# Patient Record
Sex: Female | Born: 1945
Health system: Southern US, Community
[De-identification: ages and names within clinical notes are randomized; demographics above are authoritative.]

## PROBLEM LIST (undated history)

## (undated) DIAGNOSIS — E119 Type 2 diabetes mellitus without complications: Secondary | ICD-10-CM

## (undated) DIAGNOSIS — E669 Obesity, unspecified: Secondary | ICD-10-CM

## (undated) DIAGNOSIS — I10 Essential (primary) hypertension: Secondary | ICD-10-CM

## (undated) DIAGNOSIS — E785 Hyperlipidemia, unspecified: Secondary | ICD-10-CM

## (undated) DIAGNOSIS — M109 Gout, unspecified: Secondary | ICD-10-CM

## (undated) HISTORY — DX: Type 2 diabetes mellitus without complications: E11.9

## (undated) HISTORY — DX: Hyperlipidemia, unspecified: E78.5

## (undated) HISTORY — DX: Essential (primary) hypertension: I10

## (undated) HISTORY — DX: Gout, unspecified: M10.9

## (undated) HISTORY — DX: Obesity, unspecified: E66.9

---

## 2001-12-22 ENCOUNTER — Other Ambulatory Visit: Admission: RE | Admit: 2001-12-22 | Discharge: 2001-12-22 | Payer: Self-pay | Admitting: Family Medicine

## 2002-12-12 ENCOUNTER — Emergency Department (HOSPITAL_COMMUNITY): Admission: EM | Admit: 2002-12-12 | Discharge: 2002-12-13 | Payer: Self-pay | Admitting: Emergency Medicine

## 2009-07-03 ENCOUNTER — Encounter: Admission: RE | Admit: 2009-07-03 | Discharge: 2009-07-03 | Payer: Self-pay | Admitting: Internal Medicine

## 2009-09-10 ENCOUNTER — Encounter: Admission: RE | Admit: 2009-09-10 | Discharge: 2009-09-10 | Payer: Self-pay | Admitting: Internal Medicine

## 2012-05-01 IMAGING — CR DG KNEE 1-2V*R*
3 series · 3 of 3 positions shown · non-contrast
Comparison: None.

CLINICAL DATA: RIGHT KNEE - 1-2 VIEW

[view not recorded (1 of 3)]
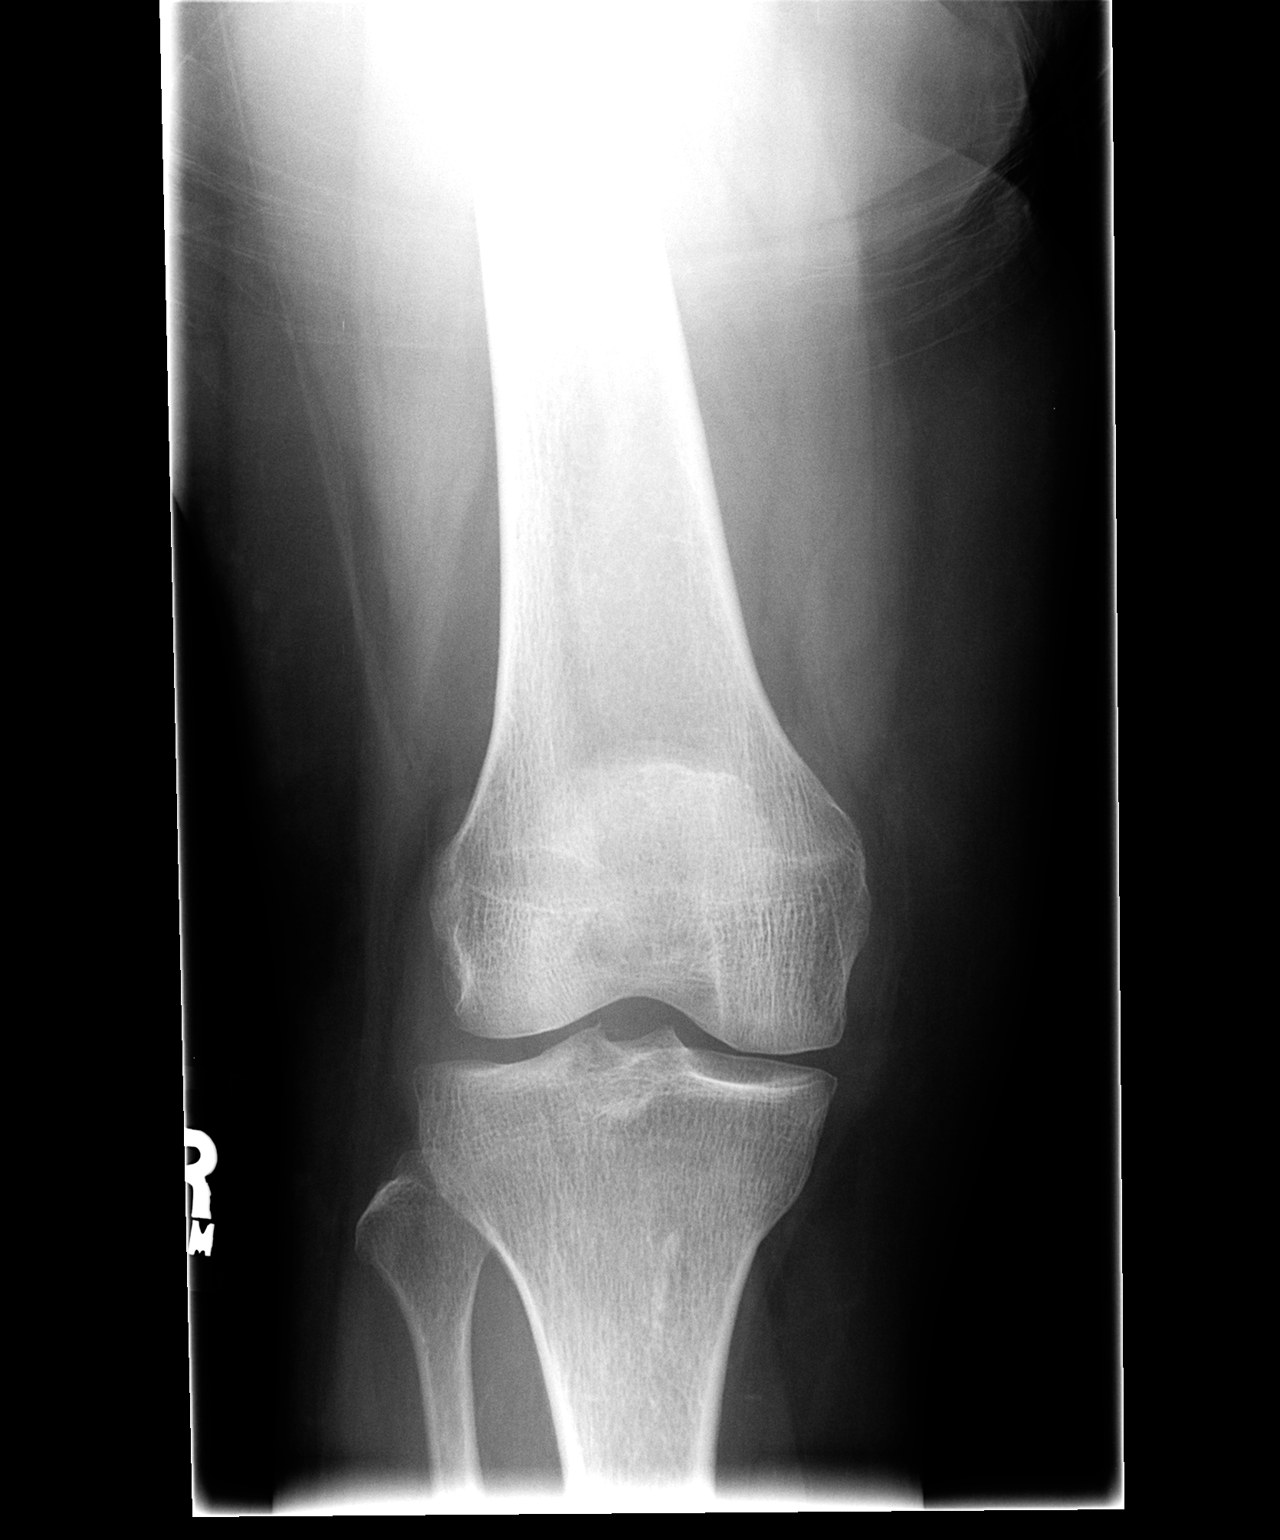

[view not recorded (2 of 3)]
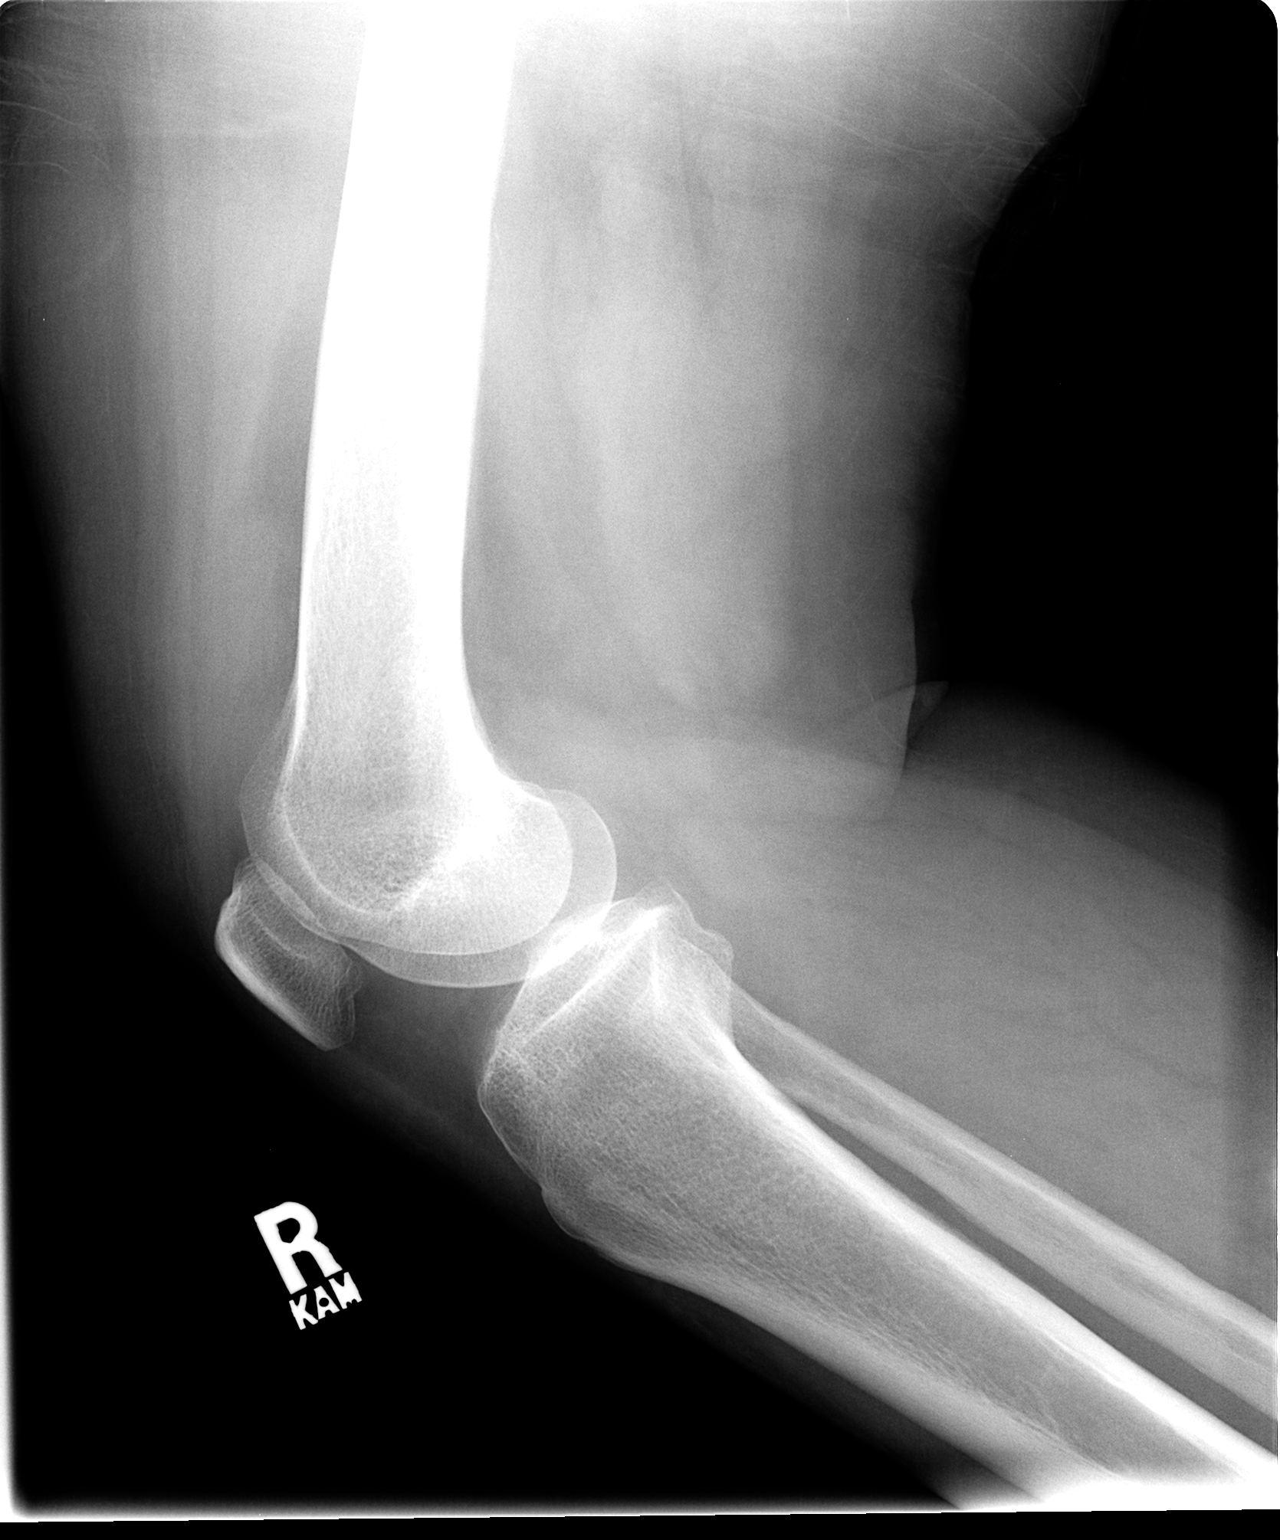

[view not recorded (3 of 3)]
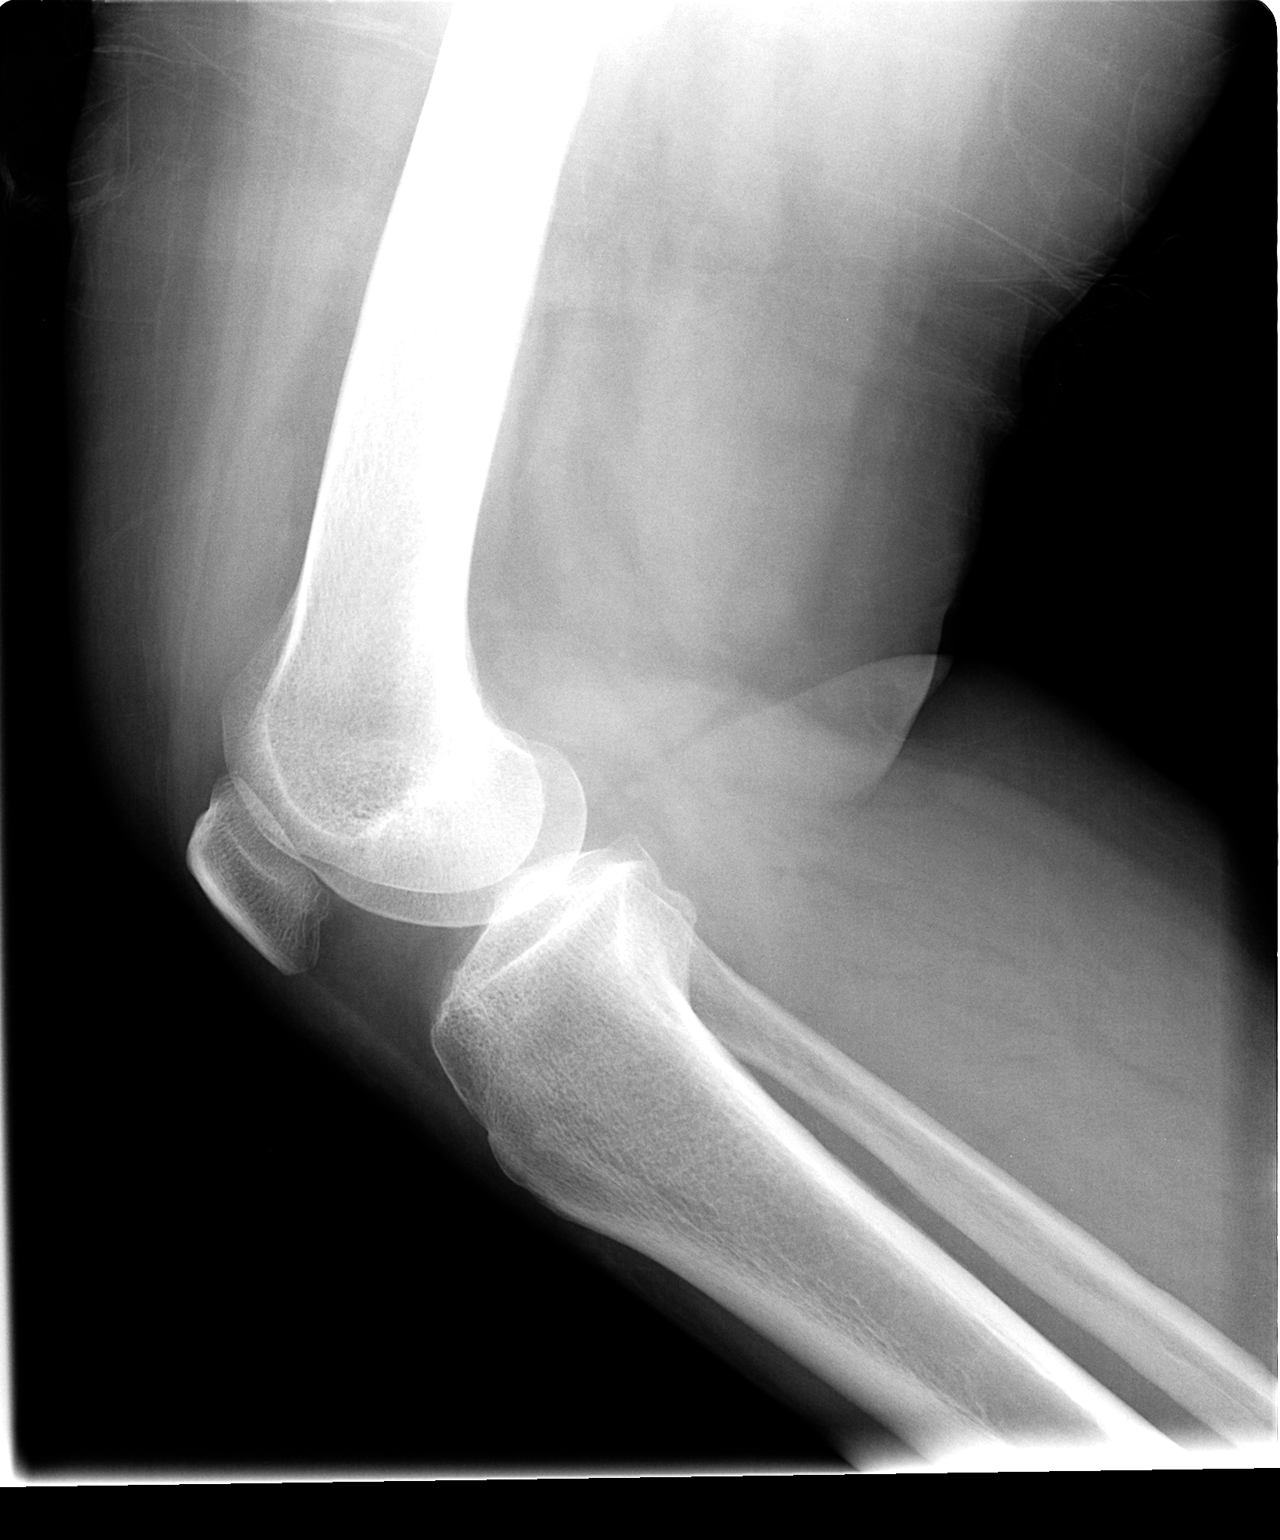

[3 of 3 positions shown; findings below may reference images not displayed]

FINDINGS: There is no evidence of fracture, dislocation, or joint
effusion.  There is no evidence of arthropathy or other focal bone
abnormality.  Soft tissues are unremarkable.
IMPRESSION: Negative.

## 2015-11-01 DIAGNOSIS — E119 Type 2 diabetes mellitus without complications: Secondary | ICD-10-CM | POA: Insufficient documentation

## 2015-11-01 DIAGNOSIS — E785 Hyperlipidemia, unspecified: Secondary | ICD-10-CM | POA: Insufficient documentation

## 2015-11-01 DIAGNOSIS — I10 Essential (primary) hypertension: Secondary | ICD-10-CM | POA: Insufficient documentation

## 2015-11-01 DIAGNOSIS — M109 Gout, unspecified: Secondary | ICD-10-CM

## 2015-11-01 HISTORY — DX: Hyperlipidemia, unspecified: E78.5

## 2015-11-01 HISTORY — DX: Gout, unspecified: M10.9

## 2016-03-19 DIAGNOSIS — R809 Proteinuria, unspecified: Secondary | ICD-10-CM | POA: Insufficient documentation

## 2017-04-23 ENCOUNTER — Ambulatory Visit: Payer: Self-pay | Admitting: Family Medicine

## 2017-04-27 ENCOUNTER — Encounter: Payer: Self-pay | Admitting: Family Medicine

## 2017-04-27 ENCOUNTER — Other Ambulatory Visit: Payer: Self-pay

## 2017-04-27 ENCOUNTER — Ambulatory Visit (INDEPENDENT_AMBULATORY_CARE_PROVIDER_SITE_OTHER): Payer: Medicare HMO | Admitting: Family Medicine

## 2017-04-27 VITALS — BP 140/72 | HR 70 | Temp 97.9°F | Ht 60.0 in | Wt 187.0 lb

## 2017-04-27 DIAGNOSIS — Z1159 Encounter for screening for other viral diseases: Secondary | ICD-10-CM

## 2017-04-27 DIAGNOSIS — Z6836 Body mass index (BMI) 36.0-36.9, adult: Secondary | ICD-10-CM | POA: Diagnosis not present

## 2017-04-27 DIAGNOSIS — I10 Essential (primary) hypertension: Secondary | ICD-10-CM | POA: Diagnosis not present

## 2017-04-27 DIAGNOSIS — E669 Obesity, unspecified: Secondary | ICD-10-CM

## 2017-04-27 DIAGNOSIS — E66812 Obesity, class 2: Secondary | ICD-10-CM

## 2017-04-27 DIAGNOSIS — E118 Type 2 diabetes mellitus with unspecified complications: Secondary | ICD-10-CM | POA: Diagnosis not present

## 2017-04-27 HISTORY — DX: Obesity, unspecified: E66.9

## 2017-04-27 LAB — POCT GLYCOSYLATED HEMOGLOBIN (HGB A1C): Hemoglobin A1C: 7.5

## 2017-04-27 MED ORDER — GLUCOSE BLOOD VI STRP: ORAL_STRIP | 12 refills | Status: AC

## 2017-04-27 MED ORDER — AMLODIPINE BESYLATE 5 MG PO TABS: 5.0000 mg | ORAL_TABLET | Freq: Every day | ORAL | 0 refills | Status: DC

## 2017-04-27 MED ORDER — METFORMIN HCL 1000 MG PO TABS: 1000.0000 mg | ORAL_TABLET | Freq: Two times a day (BID) | ORAL | 0 refills | Status: DC

## 2017-04-27 NOTE — Assessment & Plan Note (Signed)
Fasting lipid panel obtained today.

## 2017-04-27 NOTE — Assessment & Plan Note (Signed)
BP slightly elevated today but in the setting of being out of home amlodipine for the past 2 days. Will continue to monitor at future visits.

## 2017-04-27 NOTE — Progress Notes (Signed)
**Note Laura-Identified via Obfuscation** Subjective:  Laura Aguirre is a 72 y.o. female who presents to the [facility] today to establish care. History taken via Spanish video interpreter.  HPI:  Previously seen by Dr. De Aguirre, last seen 4-5 months ago Eye doctor Dr. Janina Aguirre, last saw recently and still goes to see on yearly basis.   Diabetes Takes metformin 1000mg  BID, tolerating well, ran out 2 days ago. Does not ever recall being on any other diabetes medications Disease Monitoring: checks sugars every other day at home Blood Sugar ranges-129-170,  avg 130-140 Polyuria- no New Visual problems- no  Hypoglycemic symptoms- no    HYPERTENSION  Disease Monitoring does not check at home Medications: on amlodipine 5mg  qd, tolerating well. Ran out of medications 2 days ago Chest pain- no      Dyspnea- no     Lightheadedness- no   Edema- no      ROS: per HPI, otherwise all systems reviewed and negative  PMH:  The following were reviewed and entered/updated in epic: Past Medical History:  Diagnosis Date  . Diabetes (HCC)   . HTN (hypertension)    Patient Active Problem List   Diagnosis Date Noted  . Obesity 04/27/2017  . HTN (hypertension)   . Diabetes (HCC)   . Gout 11/01/2015   History reviewed. No pertinent surgical history.  Family History  Problem Relation Age of Onset  . Diabetes Mother   . Kidney disease Mother   . Stroke Father   . High blood pressure Father     Medications- reviewed and updated Current Outpatient Medications  Medication Sig Dispense Refill  . amLODipine (NORVASC) 5 MG tablet Take 1 tablet (5 mg total) by mouth daily. 30 tablet 0  . glucose blood test strip 1 each by Other route daily as needed.    Marland Kitchen glucose blood test strip Use as instructed 100 each 12  . metFORMIN (GLUCOPHAGE) 1000 MG tablet Take 1 tablet (1,000 mg total) by mouth 2 (two) times daily with a meal. 60 tablet 0   No current facility-administered medications for this visit.     Allergies-reviewed  and updated No Known Allergies  Social History   Socioeconomic History  . Marital status: Single    Spouse name: Not on file  . Number of children: Not on file  . Years of education: Not on file  . Highest education level: Not on file  Occupational History  . Not on file  Social Needs  . Financial resource strain: Not on file  . Food insecurity:    Worry: Not on file    Inability: Not on file  . Transportation needs:    Medical: Not on file    Non-medical: Not on file  Tobacco Use  . Smoking status: Never Smoker  . Smokeless tobacco: Never Used  Substance and Sexual Activity  . Alcohol use: Never    Frequency: Never  . Drug use: Never  . Sexual activity: Not Currently  Lifestyle  . Physical activity:    Days per week: 7 days    Minutes per session: 30 min  . Stress: Not on file  Relationships  . Social connections:    Talks on phone: Not on file    Gets together: Not on file    Attends religious service: Not on file    Active member of club or organization: Not on file    Attends meetings of clubs or organizations: Not on file    Relationship status: Not on  file  Other Topics Concern  . Not on file  Social History Narrative   Lives at home with husband.     Objective:  Physical Exam: BP 140/72   Pulse 70   Temp 97.9 F (36.6 C) (Oral)   Ht 5' (1.524 m)   Wt 187 lb (84.8 kg)   SpO2 98%   BMI 36.52 kg/m   Gen: NAD, resting comfortably CV: RRR with no murmurs appreciated Pulm: NWOB, CTAB with no crackles, wheezes, or rhonchi GI: Normal bowel sounds present. Soft, Nontender, Nondistended. MSK: no edema, cyanosis, or clubbing noted Skin: warm, dry Neuro: grossly normal, moves all extremities Psych: Normal affect and thought content  Results for orders placed or performed in visit on 04/27/17 (from the past 72 hour(s))  POCT glycosylated hemoglobin (Hb A1C)     Status: None   Collection Time: 04/27/17  9:54 AM  Result Value Ref Range   Hemoglobin A1C  7.5      Assessment/Plan:  Diabetes (HCC) Doing well on metformin 1000mg  BID, refill given today. Patient is over 65yo so reasonable to have a relaxed a1c goal of 7-7.5 but as she is establishing care today will need to get ROI to obtain previous records at next visit. Will obtain baseline bloodwork and urine microalbumin.  HTN (hypertension) BP slightly elevated today but in the setting of being out of home amlodipine for the past 2 days. Will continue to monitor at future visits.  Obesity Fasting lipid panel obtained today.    Laura HerElsia J Ansley Stanwood, DO PGY-2, [facility] 04/27/2017 9:33 AM

## 2017-04-27 NOTE — Patient Instructions (Signed)
It was good to see you today!  For your diabetes and blood pressure I have refilled your medications.   We are checking some labs today. If results require attention, either myself or my nurse will get in touch with you. If everything is normal, you will get a letter in the mail or a message in My Chart. Please give us a call if you do not hear from us after 2 weeks.    Please check-out at the front desk before leaving the clinic. Make an appointment in  1 month for diabetes follow up.   Please bring all of your medications with you to each visit.   Sign up for My Chart to have easy access to your labs results, and communication with your primary care physician.  Feel free to call with any questions or concerns at any time, at [phone].   Take care,  Dr. [name] HerElsia J Nihaal Friesen, DO [facility]

## 2017-04-27 NOTE — Assessment & Plan Note (Addendum)
Doing well on metformin 1000mg  BID, refill given today. Patient is over 72yo so reasonable to have a relaxed a1c goal of 7-7.5 but as she is establishing care today will need to get ROI to obtain previous records at next visit. Will obtain baseline bloodwork and urine microalbumin.

## 2017-04-28 ENCOUNTER — Encounter: Payer: Self-pay | Admitting: Family Medicine

## 2017-04-28 ENCOUNTER — Telehealth: Payer: Self-pay | Admitting: Family Medicine

## 2017-04-28 NOTE — Telephone Encounter (Signed)
Attempted to call patient regarding lab results. Home phone number disconnected. Work called and was told that no Laura Aguirre is employed at that location. Patient has visit scheduled me in May. If she cannot be contacted via phone then will need to discuss that her ASCVD risk elevated to 25.5% at that visit.

## 2017-04-29 LAB — LIPID PANEL
Chol/HDL Ratio: 3 ratio (ref 0.0–4.4)
Cholesterol, Total: 167 mg/dL (ref 100–199)
HDL: 56 mg/dL (ref >39)
LDL Calculated: 86 mg/dL (ref 0–99)
Triglycerides: 126 mg/dL (ref 0–149)
VLDL Cholesterol Cal: 25 mg/dL (ref 5–40)

## 2017-04-29 LAB — PROTEIN / CREATININE RATIO, URINE
Creatinine, Urine: 51 mg/dL
PROTEIN UR: 20.3 mg/dL
PROTEIN/CREAT RATIO: 398 mg/g{creat} — AB (ref 0–200)

## 2017-04-29 LAB — CBC
HEMATOCRIT: 40.7 % (ref 34.0–46.6)
Hemoglobin: 13.1 g/dL (ref 11.1–15.9)
MCH: 28.6 pg (ref 26.6–33.0)
MCHC: 32.2 g/dL (ref 31.5–35.7)
MCV: 89 fL (ref 79–97)
Platelets: 221 10*3/uL (ref 150–379)
RBC: 4.58 x10E6/uL (ref 3.77–5.28)
RDW: 15 % (ref 12.3–15.4)
WBC: 6.7 10*3/uL (ref 3.4–10.8)

## 2017-04-29 LAB — BASIC METABOLIC PANEL
BUN / CREAT RATIO: 23 (ref 12–28)
BUN: 25 mg/dL (ref 8–27)
CHLORIDE: 105 mmol/L (ref 96–106)
CO2: 23 mmol/L (ref 20–29)
CREATININE: 1.07 mg/dL — AB (ref 0.57–1.00)
Calcium: 9.1 mg/dL (ref 8.7–10.3)
GFR calc non Af Amer: 53 mL/min/{1.73_m2} — ABNORMAL LOW (ref >59)
GFR, EST AFRICAN AMERICAN: 61 mL/min/{1.73_m2} (ref >59)
Glucose: 128 mg/dL — ABNORMAL HIGH (ref 65–99)
Potassium: 5.4 mmol/L — ABNORMAL HIGH (ref 3.5–5.2)
SODIUM: 141 mmol/L (ref 134–144)

## 2017-04-29 LAB — HEPATITIS C ANTIBODY

## 2017-05-04 ENCOUNTER — Telehealth: Payer: Self-pay | Admitting: Family Medicine

## 2017-05-04 NOTE — Telephone Encounter (Signed)
Patient did not receive the lancets for her blood glucose prescription.  Goes to [facility] [address].  Thank you.

## 2017-05-04 NOTE — Telephone Encounter (Signed)
Please call patient w/lab results and new cell number in chart.

## 2017-05-07 NOTE — Telephone Encounter (Signed)
Called pharmacy for this, they are unaware which glucose monitoring device she is currently using for compatibility.   I tried calling pt to ask, but unable to reach.  Will need to clarify this with patient before I call it in, thanks

## 2017-05-10 DIAGNOSIS — R69 Illness, unspecified: Secondary | ICD-10-CM | POA: Diagnosis not present

## 2017-05-10 MED ORDER — ONETOUCH DELICA LANCETS 33G MISC: 1.0000 | 0 refills | Status: DC

## 2017-05-10 MED ORDER — ONETOUCH DELICA LANCING DEV MISC: 1.0000 | 0 refills | Status: AC

## 2017-05-10 NOTE — Telephone Encounter (Signed)
LM for patient to call back to confirm which meter she is currently using but per her insurance they cover one touch verio.  This device is compatible with one touch delica lancets and lancing device.  Will forward to MD to send this brand in of her lancets and see if it is covered.  Jazmin Hartsell,CMA

## 2017-05-10 NOTE — Telephone Encounter (Signed)
Attempted to call patient via phone spanish interpreter. No response, message was left for patient to call back.

## 2017-05-10 NOTE — Telephone Encounter (Signed)
Patient called in other phone encounter via spanish phone interpreter. No response, a message was left.

## 2017-05-25 ENCOUNTER — Other Ambulatory Visit: Payer: Self-pay | Admitting: Family Medicine

## 2017-05-25 DIAGNOSIS — E118 Type 2 diabetes mellitus with unspecified complications: Secondary | ICD-10-CM

## 2017-06-01 ENCOUNTER — Other Ambulatory Visit: Payer: Self-pay

## 2017-06-01 ENCOUNTER — Ambulatory Visit (INDEPENDENT_AMBULATORY_CARE_PROVIDER_SITE_OTHER): Payer: Medicare HMO | Admitting: Family Medicine

## 2017-06-01 ENCOUNTER — Encounter: Payer: Self-pay | Admitting: Family Medicine

## 2017-06-01 VITALS — BP 124/68 | HR 73 | Temp 98.5°F | Ht 60.0 in | Wt 185.0 lb

## 2017-06-01 DIAGNOSIS — M1A9XX0 Chronic gout, unspecified, without tophus (tophi): Secondary | ICD-10-CM

## 2017-06-01 DIAGNOSIS — Z6836 Body mass index (BMI) 36.0-36.9, adult: Secondary | ICD-10-CM | POA: Diagnosis not present

## 2017-06-01 DIAGNOSIS — E118 Type 2 diabetes mellitus with unspecified complications: Secondary | ICD-10-CM | POA: Diagnosis not present

## 2017-06-01 DIAGNOSIS — I10 Essential (primary) hypertension: Secondary | ICD-10-CM

## 2017-06-01 DIAGNOSIS — E785 Hyperlipidemia, unspecified: Secondary | ICD-10-CM

## 2017-06-01 DIAGNOSIS — E669 Obesity, unspecified: Secondary | ICD-10-CM | POA: Diagnosis not present

## 2017-06-01 MED ORDER — COLCHICINE 0.6 MG PO TABS: 0.6000 mg | ORAL_TABLET | Freq: Every day | ORAL | 2 refills | Status: DC

## 2017-06-01 MED ORDER — METFORMIN HCL 1000 MG PO TABS: ORAL_TABLET | ORAL | 0 refills | Status: DC

## 2017-06-01 MED ORDER — PRAVASTATIN SODIUM 20 MG PO TABS: 20.0000 mg | ORAL_TABLET | Freq: Every day | ORAL | 0 refills | Status: DC

## 2017-06-01 MED ORDER — LISINOPRIL 10 MG PO TABS: 10.0000 mg | ORAL_TABLET | Freq: Every day | ORAL | 0 refills | Status: DC

## 2017-06-01 MED ORDER — ONETOUCH ULTRASOFT LANCETS MISC: 12 refills | Status: AC

## 2017-06-01 NOTE — Assessment & Plan Note (Signed)
Controlled and at goal.  However patient had elevated protein to creatinine ratio likely due to diabetes.  She has previously been on lisinopril 5 mg per chart review and has tolerated this well.  She will need to be on an ACEi or ARB for kidney protection so we will stop amlodipine and start lisinopril 10 mg daily.  Follow-up in 6 weeks for repeat BMP and blood pressure recheck.

## 2017-06-01 NOTE — Patient Instructions (Addendum)
For your gout - I have refilled your colchicine - We can stay off allopurinol for now, if you have more gout attacks then call me so we can restart. But do not start allopurinol during an acute attack.  For your blood pressure, - stop amlodipine, start lisinopril. - come back in 6 weeks  For your cholesterol - start pravastin in 2 weeks.   Opciones de alimentos para Stage manager de triglicridos (Food Choices to Lower Your Triglycerides) Los triglicridos son un tipo de grasas que se Hotel manager. Un nivel elevado de triglicridos puede aumentar el riesgo de padecer enfermedades cardacas e infartos. Si sus niveles de triglicridos son altos, los alimentos que se ingieren y los hbitos de alimentacin son Engineer, production. Elegir los alimentos adecuados puede ayudar a Stage manager de triglicridos. QU PAUTAS GENERALES DEBO SEGUIR?  Baje de peso si es necesario.  Limite o evite el alcohol.  Llene la mitad del plato con vegetales y ensaladas de hojas verdes.  Limite las frutas a dos porciones por da. Elija frutas en lugar de jugos.  Ocupe un cuarto del plato con cereales integrales. Busque la palabra "integral" en Estate agent de la lista de ingredientes.  Llene un cuarto del plato con alimentos con protenas magras.  Disfrute de pescados grasos (como salmn, caballa, sardinas y atn) tres veces por semana.  Elija las grasas saludables.  Limite los alimentos con alto contenido de almidn y International aid/development worker.  Consuma ms comida casera y menos de restaurante, de buf y comida rpida.  Limite el consumo de alimentos fritos.  Cocine los alimentos utilizando mtodos que no sean la fritura.  Limite el consumo de grasas saturadas.  Verifique las listas de ingredientes para evitar alimentos con aceites parcialmente hidrogenados (grasas trans).  QU ALIMENTOS PUEDO COMER? Cereales Cereales integrales, como los panes de salvado o [place], las [place], los cereales y  las pastas. Avena sin endulzar, trigo, [place], quinua o arroz integral. Tortillas de harina de maz o de salvado. Vegetales Verduras frescas o congeladas (crudas, al vapor, asadas o grilladas). Ensaladas de hojas verdes. Fruits Frutas frescas, en conserva (en su jugo natural) o frutas congeladas. Carnes y otros productos con protenas Carne de res molida (al 85% o ms [place]), carne de res de animales alimentados con pastos o carne de res sin la grasa. Pollo o pavo sin piel. Carne de pollo o de [place]. Cerdo sin la grasa. Todos los pescados y frutos de mar. Huevos. Porotos, guisantes o lentejas secos. Frutos secos o semillas sin sal. Frijoles secos o en lata sin sal. Lcteos Productos lcteos con bajo contenido de grasas, como [place] o al 1%, quesos reducidos en grasas o al 2%, ricota con bajo contenido de grasas o [employer], o yogur natural con bajo contenido de Hoffman. Grasas y Writer en barra que no contengan grasas trans. [place] y condimentos para ensaladas livianos o reducidos en grasas. [place]. Aceites de crtamo, oliva o canola. Mantequilla natural de man o almendra. Los artculos mencionados arriba pueden no ser [employer] de las bebidas o los alimentos recomendados. Comunquese con el nutricionista para conocer ms opciones. QU ALIMENTOS NO SE RECOMIENDAN? Cereales Pan blanco. Pastas blancas. Arroz blanco. Pan de maz. Bagels, pasteles y croissants. Galletas saladas que contengan grasas trans. Vegetales Papas blancas. Maz. Vegetales con crema o fritos. Verduras en salsa de [place]. Fruits Frutas secas. Fruta enlatada en almbar liviano o espeso. Jugo de frutas. Carnes y otros productos con protenas  Cortes de carne con grasa. Costillas, alas de pollo, tocineta, salchicha, mortadela, salame, chinchulines, tocino, perros calientes, salchichas alemanas y embutidos envasados. Lcteos Leche entera o al 2%, crema, mezcla de [place] y crema y  queso crema. Yogur entero o endulzado. Quesos con toda su grasa. Cremas no lcteas y coberturas batidas. Quesos procesados, quesos para untar o cuajadas. Dulces y postres Jarabe de maz, azcares, miel y Radio broadcast assistant. Caramelos. Mermelada y [place]. Doreen Beam. Cereales endulzados. Galletas, pasteles, bizcochuelos, donas, muffins y helado. Grasas y [address], [place] en barra, [place] de [place], [place], [place] clarificada o grasa de tocino. Aceites de coco, de palmiste o de palma. Bebidas Alcohol. Bebidas endulzadas (como refrescos, limonadas y bebidas frutales o ponches). Los artculos mencionados arriba pueden no ser [employer] de las bebidas y los alimentos que se Theatre stage manager. Comunquese con el nutricionista para recibir ms informacin. Esta informacin no tiene Theme park manager el consejo del mdico. Asegrese de hacerle al mdico cualquier pregunta que tenga. Document Released: 06/18/2009 Document Revised: 01/03/2013 Document Reviewed: 11/02/2012 Elsevier Interactive Patient Education  2017 [employer].

## 2017-06-01 NOTE — Assessment & Plan Note (Signed)
Patient's 89yr ASCVD risk is 20.7% and her LDL is 89 above goal of 70.  Discussed starting low intensity statin as patient has issues with gout and is on colchicine which can interact causing myalgias.  Patient was counseled regarding this possible side effect and she will call if she experiences muscle aches.  She will start this in 2 weeks as she is currently switching blood pressure medications and we discussed avoiding multiple medication changes at one time.

## 2017-06-01 NOTE — Assessment & Plan Note (Signed)
At goal for age.  Continue metformin 1000 mg twice daily.

## 2017-06-01 NOTE — Progress Notes (Signed)
    Subjective:  Laura Aguirre is a 72 y.o. female who presents to the [facility] today for diabetes and gout follow-up.  HPI:  Gout attack Last week, L ankle and R big toe. Now resolved after taking colchicine. Needs refills.  Had been 6 months since last gout attack. Has been off allopurinol for 6 months as well. Reluctant to restart allopurinol as wants to minimize her pill burden.  Thinks that this flare was due to eating beef.   Diabetes Taking metformin  BID, compliant and tolerating well.  Checks her sugars at home because her last doctor told her to try and keep her sugars at around 120 that was when she was on additional medication per chart review this appears to be invokana. Denies hypoglycemic events.  No polyuria, polydipsia.  No vision changes.  HTN Recalls being on lisinopril in the past and had tolerated well in the past without side effects  No lightheadedness, dizziness, chest pain, shortness of breath, lower extremity edema Does not check blood pressure at home Has been taking amlodipine 5 mg daily, compliant, tolerating well.    ROS: Per HPI  PMH: She reports that she has never smoked. She has never used smokeless tobacco. She reports that she does not drink alcohol or use drugs.  Objective:  Physical Exam: BP 124/68   Pulse 73   Temp 98.5 F (36.9 C) (Oral)   Ht 5' (1.524 m)   Wt 185 lb (83.9 kg)   SpO2 97%   BMI 36.13 kg/m   Gen: NAD, resting comfortably CV: RRR with no murmurs appreciated Pulm: NWOB, CTAB with no crackles, wheezes, or rhonchi GI: Normal bowel sounds present. Soft, Nontender, Nondistended. MSK: no edema, cyanosis, or clubbing noted Skin: warm, dry Neuro: grossly normal, moves all extremities Psych: Normal affect and thought content   Assessment/Plan:  Diabetes mellitus (HCC) At goal for age.  Continue metformin 1000 mg twice daily.  Essential hypertension Controlled and at goal.  However patient had elevated protein to  creatinine ratio likely due to diabetes.  She has previously been on lisinopril 5 mg per chart review and has tolerated this well.  She will need to be on an ACEi or ARB for kidney protection so we will stop amlodipine and start lisinopril 10 mg daily.  Follow-up in 6 weeks for repeat BMP and blood pressure recheck.  Hyperlipidemia Patient's 20yr ASCVD risk is 20.7% and her LDL is 89 above goal of 70.  Discussed starting low intensity statin as patient has issues with gout and is on colchicine which can interact causing myalgias.  Patient was counseled regarding this possible side effect and she will call if she experiences muscle aches.  She will start this in 2 weeks as she is currently switching blood pressure medications and we discussed avoiding multiple medication changes at one time.   [name] Her, DO PGY-2, [facility] 06/01/2017 9:37 AM

## 2017-07-09 ENCOUNTER — Other Ambulatory Visit: Payer: Self-pay | Admitting: Family Medicine

## 2017-07-09 DIAGNOSIS — I10 Essential (primary) hypertension: Secondary | ICD-10-CM

## 2017-07-14 ENCOUNTER — Other Ambulatory Visit: Payer: Self-pay | Admitting: Family Medicine

## 2017-07-14 DIAGNOSIS — E669 Obesity, unspecified: Secondary | ICD-10-CM

## 2017-07-14 DIAGNOSIS — Z6836 Body mass index (BMI) 36.0-36.9, adult: Principal | ICD-10-CM

## 2017-07-19 ENCOUNTER — Other Ambulatory Visit: Payer: Self-pay

## 2017-07-19 DIAGNOSIS — E118 Type 2 diabetes mellitus with unspecified complications: Secondary | ICD-10-CM

## 2017-07-19 MED ORDER — METFORMIN HCL 1000 MG PO TABS: ORAL_TABLET | ORAL | 0 refills | Status: DC

## 2017-08-07 ENCOUNTER — Other Ambulatory Visit: Payer: Self-pay | Admitting: Family Medicine

## 2017-08-07 DIAGNOSIS — I10 Essential (primary) hypertension: Secondary | ICD-10-CM

## 2017-08-12 ENCOUNTER — Ambulatory Visit (INDEPENDENT_AMBULATORY_CARE_PROVIDER_SITE_OTHER): Payer: Medicare HMO | Admitting: Family Medicine

## 2017-08-12 ENCOUNTER — Encounter: Payer: Self-pay | Admitting: Family Medicine

## 2017-08-12 ENCOUNTER — Other Ambulatory Visit: Payer: Self-pay

## 2017-08-12 VITALS — BP 138/69 | HR 70 | Temp 98.3°F | Wt 186.0 lb

## 2017-08-12 DIAGNOSIS — Z0000 Encounter for general adult medical examination without abnormal findings: Secondary | ICD-10-CM

## 2017-08-12 DIAGNOSIS — I10 Essential (primary) hypertension: Secondary | ICD-10-CM | POA: Diagnosis not present

## 2017-08-12 DIAGNOSIS — E669 Obesity, unspecified: Secondary | ICD-10-CM | POA: Diagnosis not present

## 2017-08-12 DIAGNOSIS — E118 Type 2 diabetes mellitus with unspecified complications: Secondary | ICD-10-CM

## 2017-08-12 DIAGNOSIS — Z6836 Body mass index (BMI) 36.0-36.9, adult: Secondary | ICD-10-CM | POA: Diagnosis not present

## 2017-08-12 DIAGNOSIS — Z1231 Encounter for screening mammogram for malignant neoplasm of breast: Secondary | ICD-10-CM | POA: Diagnosis not present

## 2017-08-12 DIAGNOSIS — M1A9XX0 Chronic gout, unspecified, without tophus (tophi): Secondary | ICD-10-CM

## 2017-08-12 DIAGNOSIS — Z1239 Encounter for other screening for malignant neoplasm of breast: Secondary | ICD-10-CM

## 2017-08-12 DIAGNOSIS — E785 Hyperlipidemia, unspecified: Secondary | ICD-10-CM | POA: Diagnosis not present

## 2017-08-12 LAB — POCT GLYCOSYLATED HEMOGLOBIN (HGB A1C): HbA1c, POC (controlled diabetic range): 6.8 % (ref 0.0–7.0)

## 2017-08-12 MED ORDER — METFORMIN HCL 1000 MG PO TABS: ORAL_TABLET | ORAL | 3 refills | Status: DC

## 2017-08-12 MED ORDER — ALLOPURINOL 100 MG PO TABS: 100.0000 mg | ORAL_TABLET | Freq: Every day | ORAL | 3 refills | Status: DC

## 2017-08-12 MED ORDER — PRAVASTATIN SODIUM 20 MG PO TABS: ORAL_TABLET | ORAL | 3 refills | Status: DC

## 2017-08-12 MED ORDER — LISINOPRIL 10 MG PO TABS: ORAL_TABLET | ORAL | 3 refills | Status: DC

## 2017-08-12 NOTE — Patient Instructions (Signed)
Your diabetes is good.  No changes to medications today.  Please get your mammogram.  Call your eye doctor for appointment and send us the records.  See you in  6 months.

## 2017-08-12 NOTE — Assessment & Plan Note (Signed)
Doing well on pravastatin 20mg  qd. No myalgias. Continue current management

## 2017-08-12 NOTE — Assessment & Plan Note (Addendum)
Stable and well controlled. Continue metformin 1000mg  BID Patient to schedule her own DM eye exam and plans to request records to be sent here.

## 2017-08-12 NOTE — Assessment & Plan Note (Signed)
Stable and at goal. The one episode of lightheadedness is likely not clinically significant. Advised good water intake during hot weather to avoid standing up too quickly if this happens again. Continue lisinopril 10mg  qd.

## 2017-08-12 NOTE — Assessment & Plan Note (Signed)
Mammogram ordered today 

## 2017-08-12 NOTE — Progress Notes (Signed)
    Subjective:  Laura Aguirre is a 72 y.o. female who [facility] today for follow up. History via Spanish video interpreter  HPI:  HYPERTENSION   Medications taking lisinopril 10mg  qd Compliance- good  Chest pain- no      Dyspnea- no  Edema- no Did have one episode of lightheadness/dizziness with sweating when she stood up too quickly. This was unusual for her and has not happened since       DIABETES  Medications metformin 1000mg  BID, tolerating well Compliance- good Has been eating healthier recently  Hypoglycemic symptoms- no  Polyuria- no New Visual problems- no Has not seen an eye doctor in the last year, plans to call and schedule      HYPERLIPIDEMIA  Medications: taking pravastatin 20mg  daily, tolerating well Compliance- good RUQ pain- no  Muscle aches- no    GOUT Takes colchicine for this. Has not had a gout flare for a long time. Has been out of allopurinol for the past 6-7 months.   ROS See HPI above   PMH Smoking Status noted     Objective:  Physical Exam: BP 138/69   Pulse 70   Temp 98.3 F (36.8 C) (Oral)   Wt 186 lb (84.4 kg)   SpO2 97%   BMI 36.33 kg/m   Gen: NAD, resting comfortably CV: RRR with no murmurs appreciated Pulm: NWOB, CTAB with no crackles, wheezes, or rhonchi GI: Normal bowel sounds present. Soft, Nontender, Nondistended. MSK: no edema, cyanosis, or clubbing noted Skin: warm, dry Neuro: grossly normal, moves all extremities Psych: Normal affect and thought content  Results for orders placed or performed in visit on 08/12/17 (from the past 72 hour(s))  HgB A1c     Status: None   Collection Time: 08/12/17  9:23 AM  Result Value Ref Range   Hemoglobin A1C  4.0 - 5.6 %   HbA1c POC (<> result, manual entry)  4.0 - 5.6 %   HbA1c, POC (prediabetic range)  5.7 - 6.4 %   HbA1c, POC (controlled diabetic range) 6.8 0.0 - 7.0 %     Assessment/Plan:  Essential hypertension Stable and at goal. The one episode of  lightheadedness is likely not clinically significant. Advised good water intake during hot weather to avoid standing up too quickly if this happens again. Continue lisinopril 10mg  qd.  Diabetes mellitus (HCC) Stable and well controlled. Continue metformin 1000mg  BID Patient to schedule her own DM eye exam and plans to request records to be sent here.  Hyperlipidemia Doing well on pravastatin 20mg  qd. No myalgias. Continue current management  Gout Doing well without any acute flares but has been off her allopurinol for the last 6-7 months. Refill given and recommended prophylaxis with colchicine over the next few months.  Healthcare maintenance Mammogram ordered today.   [name] HerElsia J Malakie Balis, DO PGY-3, [facility] 08/12/2017 9:28 AM

## 2017-08-12 NOTE — Assessment & Plan Note (Signed)
Doing well without any acute flares but has been off her allopurinol for the last 6-7 months. Refill given and recommended prophylaxis with colchicine over the next few months.

## 2018-06-30 ENCOUNTER — Ambulatory Visit (INDEPENDENT_AMBULATORY_CARE_PROVIDER_SITE_OTHER): Payer: Medicare HMO | Admitting: Family Medicine

## 2018-06-30 ENCOUNTER — Other Ambulatory Visit: Payer: Self-pay

## 2018-06-30 VITALS — BP 110/70 | HR 85

## 2018-06-30 DIAGNOSIS — M1A9XX0 Chronic gout, unspecified, without tophus (tophi): Secondary | ICD-10-CM | POA: Diagnosis not present

## 2018-06-30 DIAGNOSIS — I10 Essential (primary) hypertension: Secondary | ICD-10-CM | POA: Diagnosis not present

## 2018-06-30 DIAGNOSIS — E119 Type 2 diabetes mellitus without complications: Secondary | ICD-10-CM | POA: Diagnosis not present

## 2018-06-30 LAB — POCT GLYCOSYLATED HEMOGLOBIN (HGB A1C): HbA1c, POC (controlled diabetic range): 7.7 % — AB (ref 0.0–7.0)

## 2018-06-30 MED ORDER — IBUPROFEN 600 MG PO TABS: 600.0000 mg | ORAL_TABLET | Freq: Three times a day (TID) | ORAL | 0 refills | Status: DC

## 2018-06-30 NOTE — Patient Instructions (Signed)
Take the ibuprofen 3 times per day with food, for the next 5 days. Call us if you have any stomach concerns. It should become less swollen and less painful, but the deposits under the skin may take several weeks to go away.

## 2018-06-30 NOTE — Assessment & Plan Note (Signed)
a1c up today, discuss with PCP next month. May be due to acute inflammation of gout raising CBGs.

## 2018-06-30 NOTE — Progress Notes (Signed)
   CC: finger swollen   HPI  Mass on L 5th digit - has been taking her gout pill x 5 days colchicine. Hx of guot in her feet before, this is the first time in her finger. For colchicine she took 2 then 1 daily. Her pain got a little better after this, but not the swelling.   DM - she is not checking CBGs due to good control.   ROS: Denies CP, SOB, abdominal pain, dysuria, changes in BMs.   CC, SH/smoking status, and VS noted  Objective: BP 110/70   Pulse 85   SpO2 97%  Gen: NAD, alert, cooperative, and pleasant. HEENT: NCAT, EOMI, PERRL CV: RRR, no murmur Resp: CTAB, no wheezes, non-labored Ext: No edema, warm. L 5th digit swollen at DIP with erythema and warmth, can visualize tophaceous deposit under skin.  Neuro: Alert and oriented, Speech clear, No gross deficits  Assessment and plan:  Gout Tried colchicine at home. Use ibuprofen TID x 5 days and take with food. Instructed her to call if c/w abdominal pain or reflux, we could do a H2 blocker with this if so. If this does not help, could consider prednisone in a patient with metformin controlled DM, although would use caution in NSAIDs + glucocorticoids as this portends GI ulcers. Instructed her to follow up with PCP in 1 month to check uric acid level at asymptomatic state to titrate allopurinol dosing.   Diabetes mellitus ([place]) a1c up today, discuss with PCP next month. May be due to acute inflammation of gout raising CBGs.    Orders Placed This Encounter  Procedures  . Basic metabolic panel  . POCT glycosylated hemoglobin (Hb A1C)    Meds ordered this encounter  Medications  . ibuprofen (ADVIL) 600 MG tablet    Sig: Take 1 tablet (600 mg total) by mouth 3 (three) times daily. With food    Dispense:  30 tablet    Refill:  0    Ralene Ok, MD, PGY3 06/30/2018 10:56 AM

## 2018-06-30 NOTE — Assessment & Plan Note (Signed)
Tried colchicine at home. Use ibuprofen TID x 5 days and take with food. Instructed her to call if c/w abdominal pain or reflux, we could do a H2 blocker with this if so. If this does not help, could consider prednisone in a patient with metformin controlled DM, although would use caution in NSAIDs + glucocorticoids as this portends GI ulcers. Instructed her to follow up with PCP in 1 month to check uric acid level at asymptomatic state to titrate allopurinol dosing.

## 2018-07-01 LAB — BASIC METABOLIC PANEL
BUN/Creatinine Ratio: 21 (ref 12–28)
BUN: 25 mg/dL (ref 8–27)
CO2: 22 mmol/L (ref 20–29)
Calcium: 9 mg/dL (ref 8.7–10.3)
Chloride: 104 mmol/L (ref 96–106)
Creatinine, Ser: 1.19 mg/dL — ABNORMAL HIGH (ref 0.57–1.00)
GFR calc Af Amer: 53 mL/min/{1.73_m2} — ABNORMAL LOW (ref >59)
GFR calc non Af Amer: 46 mL/min/{1.73_m2} — ABNORMAL LOW (ref >59)
Glucose: 136 mg/dL — ABNORMAL HIGH (ref 65–99)
Potassium: 5.3 mmol/L — ABNORMAL HIGH (ref 3.5–5.2)
Sodium: 139 mmol/L (ref 134–144)

## 2018-07-06 ENCOUNTER — Other Ambulatory Visit: Payer: Self-pay | Admitting: Family Medicine

## 2018-08-02 ENCOUNTER — Ambulatory Visit (INDEPENDENT_AMBULATORY_CARE_PROVIDER_SITE_OTHER): Payer: Medicare HMO | Admitting: Family Medicine

## 2018-08-02 ENCOUNTER — Encounter: Payer: Self-pay | Admitting: Family Medicine

## 2018-08-02 ENCOUNTER — Other Ambulatory Visit: Payer: Self-pay

## 2018-08-02 VITALS — BP 142/84 | HR 65

## 2018-08-02 DIAGNOSIS — E119 Type 2 diabetes mellitus without complications: Secondary | ICD-10-CM | POA: Diagnosis not present

## 2018-08-02 DIAGNOSIS — M1A9XX0 Chronic gout, unspecified, without tophus (tophi): Secondary | ICD-10-CM

## 2018-08-02 MED ORDER — ALLOPURINOL 100 MG PO TABS: 100.0000 mg | ORAL_TABLET | Freq: Every day | ORAL | 6 refills | Status: DC

## 2018-08-02 NOTE — Progress Notes (Signed)
   Subjective:    Laura Aguirre - 73 y.o. 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  HPI: Gout tophus Started 1 month ago with swelling, erythema, and pain Used colchicine and ibuprofen, which were helpful for pain, but the swelling and redness did not resolve This has occurred before, but was on her great toe of L foot.  She says that she got injections and took medications for her left toe, and the tophus eventually resolved. She is not currently taking allopurinol. Her tophus is not painful except if she were to hit it on something.  Health Maintenance:  Health Maintenance Due  Topic Date Due  . OPHTHALMOLOGY EXAM  03/11/1957  . TETANUS/TDAP  03/11/1966  . MAMMOGRAM  03/11/1997  . COLONOSCOPY  03/11/1997  . PNA vac Low Risk Adult (1 of 2 - PCV13) 03/11/2012  . FOOT EXAM  04/28/2018    -  reports that she has never smoked. She has never used smokeless tobacco. - Review of Systems: Per HPI. - Past Medical History: Patient Active Problem List   Diagnosis Date Noted  . Healthcare maintenance 08/12/2017  . Obesity 04/27/2017  . Microalbuminuria 03/19/2016  . Essential hypertension 11/01/2015  . Diabetes mellitus ([place]) 11/01/2015  . Gout 11/01/2015  . Hyperlipidemia 11/01/2015   - Medications: reviewed and updated   Objective:   Physical Exam BP (!) 142/84   Pulse 65   SpO2 98%  Gen: NAD, alert, cooperative with exam, well-appearing, pleasant CV: RRR, good S1/S2, no murmur, no edema Resp: CTABL, no wheezes, non-labored Musculoskeletal: Swelling and erythema is visualized on the DIP joint of the fifth digit on the left hand.  This is nontender to palpation and is very firm to palpation.  No signs of infection or cuticle involvement. Psych: good insight, alert and oriented        Assessment & Plan:   Gout We will measure uric acid level today and start allopurinol for prophylaxis  and hopeful improvement of her tophus over time.  Counseled patient to take allopurinol 100 mg daily and to take colchicine 0.6 mg once daily for the first 2 to 3 days after starting allopurinol.  Counseled patient that her tophus will hopefully decrease in size in the future, but that I unfortunately cannot promise that this will happen.  We will plan to see her again in 2 to 3 months to follow-up on her diabetes or earlier if needed.    Maia Breslow, M.D. 08/02/2018, 2:15 PM PGY-3, [place]

## 2018-08-02 NOTE — Patient Instructions (Addendum)
It was nice meeting you today Laura Aguirre!  We are measuring a uric acid level today, which helps [place] treat your gout.    Please start taking allopurinol once daily.  I have sent more medication to your pharmacy today.  Please take colchicine once daily for the first 2-3 days of starting the allopurinol.    I would like to see you back in 2-3 months to check on your diabetes.   If you have any questions or concerns, please feel free to call the clinic.   Be well,  Dr. Shan Levans

## 2018-08-02 NOTE — Assessment & Plan Note (Signed)
We will measure uric acid level today and start allopurinol for prophylaxis and hopeful improvement of her tophus over time.  Counseled patient to take allopurinol 100 mg daily and to take colchicine 0.6 mg once daily for the first 2 to 3 days after starting allopurinol.  Counseled patient that her tophus will hopefully decrease in size in the future, but that I unfortunately cannot promise that this will happen.  We will plan to see her again in 2 to 3 months to follow-up on her diabetes or earlier if needed.

## 2018-08-03 LAB — URIC ACID: Uric Acid: 10.8 mg/dL — ABNORMAL HIGH (ref 2.5–7.1)

## 2018-08-04 ENCOUNTER — Other Ambulatory Visit: Payer: Self-pay

## 2018-08-04 DIAGNOSIS — E669 Obesity, unspecified: Secondary | ICD-10-CM

## 2018-08-04 DIAGNOSIS — Z6836 Body mass index (BMI) 36.0-36.9, adult: Secondary | ICD-10-CM

## 2018-08-04 MED ORDER — PRAVASTATIN SODIUM 20 MG PO TABS: ORAL_TABLET | ORAL | 3 refills | Status: DC

## 2018-08-08 MED ORDER — PRAVASTATIN SODIUM 20 MG PO TABS: ORAL_TABLET | ORAL | 3 refills | Status: DC

## 2018-08-08 NOTE — Telephone Encounter (Signed)
Transmission failed. Resent. Jessica Fleeger, CMA  

## 2018-08-08 NOTE — Addendum Note (Signed)
Addended by: Christen Bame D on: 08/08/2018 01:38 PM   Modules accepted: Orders

## 2018-09-20 ENCOUNTER — Other Ambulatory Visit: Payer: Self-pay

## 2018-09-20 DIAGNOSIS — E118 Type 2 diabetes mellitus with unspecified complications: Secondary | ICD-10-CM

## 2018-09-20 MED ORDER — METFORMIN HCL 1000 MG PO TABS: ORAL_TABLET | ORAL | 3 refills | Status: DC

## 2018-10-21 ENCOUNTER — Other Ambulatory Visit: Payer: Self-pay

## 2018-10-21 DIAGNOSIS — I10 Essential (primary) hypertension: Secondary | ICD-10-CM

## 2018-10-21 MED ORDER — LISINOPRIL 10 MG PO TABS: ORAL_TABLET | ORAL | 3 refills | Status: DC

## 2018-11-22 ENCOUNTER — Other Ambulatory Visit: Payer: Self-pay

## 2018-11-22 MED ORDER — IBUPROFEN 600 MG PO TABS: 600.0000 mg | ORAL_TABLET | Freq: Three times a day (TID) | ORAL | 0 refills | Status: DC

## 2018-11-29 ENCOUNTER — Other Ambulatory Visit: Payer: Self-pay | Admitting: Family Medicine

## 2018-12-13 ENCOUNTER — Other Ambulatory Visit: Payer: Self-pay | Admitting: Family Medicine

## 2018-12-27 DIAGNOSIS — R69 Illness, unspecified: Secondary | ICD-10-CM | POA: Diagnosis not present

## 2019-02-10 ENCOUNTER — Other Ambulatory Visit: Payer: Self-pay | Admitting: Family Medicine

## 2019-10-20 ENCOUNTER — Other Ambulatory Visit: Payer: Self-pay | Admitting: *Deleted

## 2019-10-20 DIAGNOSIS — E118 Type 2 diabetes mellitus with unspecified complications: Secondary | ICD-10-CM

## 2019-10-20 DIAGNOSIS — I10 Essential (primary) hypertension: Secondary | ICD-10-CM

## 2019-10-22 MED ORDER — LISINOPRIL 10 MG PO TABS: ORAL_TABLET | ORAL | 3 refills | Status: DC

## 2019-10-22 MED ORDER — METFORMIN HCL 1000 MG PO TABS: ORAL_TABLET | ORAL | 3 refills | Status: DC

## 2019-11-14 ENCOUNTER — Ambulatory Visit: Payer: Medicare HMO | Attending: Internal Medicine

## 2019-11-14 DIAGNOSIS — E119 Type 2 diabetes mellitus without complications: Secondary | ICD-10-CM | POA: Diagnosis not present

## 2019-11-14 DIAGNOSIS — H25099 Other age-related incipient cataract, unspecified eye: Secondary | ICD-10-CM | POA: Diagnosis not present

## 2019-11-14 DIAGNOSIS — Z23 Encounter for immunization: Secondary | ICD-10-CM

## 2019-11-14 DIAGNOSIS — H11159 Pinguecula, unspecified eye: Secondary | ICD-10-CM | POA: Diagnosis not present

## 2019-11-14 DIAGNOSIS — Z7984 Long term (current) use of oral hypoglycemic drugs: Secondary | ICD-10-CM | POA: Diagnosis not present

## 2019-11-14 DIAGNOSIS — I10 Essential (primary) hypertension: Secondary | ICD-10-CM | POA: Diagnosis not present

## 2019-11-14 DIAGNOSIS — H52221 Regular astigmatism, right eye: Secondary | ICD-10-CM | POA: Diagnosis not present

## 2019-11-14 DIAGNOSIS — H5203 Hypermetropia, bilateral: Secondary | ICD-10-CM | POA: Diagnosis not present

## 2019-11-14 DIAGNOSIS — H524 Presbyopia: Secondary | ICD-10-CM | POA: Diagnosis not present

## 2019-11-14 NOTE — Progress Notes (Signed)
   Covid-19 Vaccination Clinic  Name:  Laura Aguirre    MRN: [id number] DOB: [birth year]  11/14/2019  Laura Aguirre was observed post Covid-19 immunization for 15 minutes without incident. She was provided with Vaccine Information Sheet and instruction to access the V-Safe system.   Laura Aguirre was instructed to call 911 with any severe reactions post vaccine: Marland Kitchen Difficulty breathing  . Swelling of face and throat  . A fast heartbeat  . A bad rash all over body  . Dizziness and weakness

## 2019-11-15 ENCOUNTER — Other Ambulatory Visit: Payer: Self-pay

## 2019-11-15 DIAGNOSIS — E669 Obesity, unspecified: Secondary | ICD-10-CM

## 2019-11-15 MED ORDER — PRAVASTATIN SODIUM 20 MG PO TABS: ORAL_TABLET | ORAL | 3 refills | Status: DC

## 2019-12-02 DIAGNOSIS — Z0100 Encounter for examination of eyes and vision without abnormal findings: Secondary | ICD-10-CM | POA: Diagnosis not present

## 2019-12-27 ENCOUNTER — Other Ambulatory Visit: Payer: Self-pay | Admitting: *Deleted

## 2019-12-27 DIAGNOSIS — M1A9XX0 Chronic gout, unspecified, without tophus (tophi): Secondary | ICD-10-CM

## 2019-12-27 MED ORDER — COLCHICINE 0.6 MG PO TABS: 0.6000 mg | ORAL_TABLET | Freq: Every day | ORAL | 2 refills | Status: DC

## 2020-05-01 ENCOUNTER — Other Ambulatory Visit: Payer: Self-pay | Admitting: *Deleted

## 2020-05-02 MED ORDER — ALLOPURINOL 100 MG PO TABS: ORAL_TABLET | ORAL | 5 refills | Status: DC

## 2020-05-20 ENCOUNTER — Other Ambulatory Visit: Payer: Self-pay | Admitting: Family Medicine

## 2020-05-20 DIAGNOSIS — M1A9XX0 Chronic gout, unspecified, without tophus (tophi): Secondary | ICD-10-CM

## 2020-05-28 ENCOUNTER — Other Ambulatory Visit: Payer: Self-pay | Admitting: Family Medicine

## 2020-05-28 DIAGNOSIS — M1A9XX0 Chronic gout, unspecified, without tophus (tophi): Secondary | ICD-10-CM

## 2020-08-16 ENCOUNTER — Other Ambulatory Visit: Payer: Self-pay | Admitting: *Deleted

## 2020-08-16 DIAGNOSIS — M1A9XX0 Chronic gout, unspecified, without tophus (tophi): Secondary | ICD-10-CM

## 2020-08-17 MED ORDER — COLCHICINE 0.6 MG PO TABS: ORAL_TABLET | ORAL | 0 refills | Status: DC

## 2020-10-24 ENCOUNTER — Other Ambulatory Visit: Payer: Self-pay | Admitting: Family Medicine

## 2020-10-24 DIAGNOSIS — E118 Type 2 diabetes mellitus with unspecified complications: Secondary | ICD-10-CM

## 2020-10-24 DIAGNOSIS — I10 Essential (primary) hypertension: Secondary | ICD-10-CM

## 2021-02-12 ENCOUNTER — Other Ambulatory Visit: Payer: Self-pay | Admitting: Family Medicine

## 2021-02-12 DIAGNOSIS — M1A9XX0 Chronic gout, unspecified, without tophus (tophi): Secondary | ICD-10-CM

## 2021-04-26 ENCOUNTER — Other Ambulatory Visit: Payer: Self-pay | Admitting: Family Medicine

## 2021-04-26 DIAGNOSIS — M1A9XX0 Chronic gout, unspecified, without tophus (tophi): Secondary | ICD-10-CM

## 2021-05-04 ENCOUNTER — Other Ambulatory Visit: Payer: Self-pay | Admitting: Family Medicine

## 2021-05-24 ENCOUNTER — Other Ambulatory Visit: Payer: Self-pay | Admitting: Family Medicine

## 2021-05-24 DIAGNOSIS — E669 Obesity, unspecified: Secondary | ICD-10-CM

## 2021-05-27 ENCOUNTER — Encounter: Payer: Self-pay | Admitting: *Deleted

## 2021-05-28 NOTE — Congregational Nurse Program (Signed)
?  Dept: [phone] ? ? ?Congregational Nurse Program Note ? ?Date of Encounter: 05/27/2021 ? ?Past Medical History: ?Past Medical History:  ?Diagnosis Date  ? Diabetes (HCC)   ? Gout 11/01/2015  ? HTN (hypertension)   ? Hyperlipidemia 11/01/2015  ? Obesity 04/27/2017  ? ? ?Encounter Details: ? CNP Questionnaire - 05/27/21 1800   ? ?  ? Questionnaire  ? Do you give verbal consent to treat you today? Yes   ? Location Patient Served  [facility]   ? Visit Setting Church or Organization   ? Patient Status Immigrant   ? Insurance Medicare   ? Insurance Referral N/A   ? Medication N/A   ? Medical Provider Yes   ? Screening Referrals N/A   ? Medical Referral N/A   ? Medical Appointment Made N/A   ? Food Have Food Insecurities   ? Transportation N/A   ? Housing/Utilities N/A   ? Interpersonal Safety N/A   ? Intervention Blood pressure;Blood glucose;Support;Educate   ? ED Visit Averted Yes   ? Life-Saving Intervention Made N/A   ? ?  ?  ? ?  ? ?BP 153/77 HR 73 and fingerstick glucose test 92.  Client seen at nurse clinic at [facility].  Encouraged client to go see PCP at her earliest convenience for check up.  Client is on a BP medication.  Used Spanish interpreter to speak with client.  Educated client about normal and abnormal BP and glucose results.  Client will follow up with this CN as needed. ? ?Roderic Palau, RN, MSN, CNP ?[phone] Office ?[phone] Cell ? ? ? ?

## 2021-07-08 ENCOUNTER — Other Ambulatory Visit: Payer: Self-pay | Admitting: Family Medicine

## 2021-07-08 DIAGNOSIS — M1A9XX0 Chronic gout, unspecified, without tophus (tophi): Secondary | ICD-10-CM

## 2021-07-09 ENCOUNTER — Other Ambulatory Visit: Payer: Self-pay | Admitting: Family Medicine

## 2021-07-09 ENCOUNTER — Telehealth: Payer: Self-pay

## 2021-07-09 DIAGNOSIS — M1A9XX0 Chronic gout, unspecified, without tophus (tophi): Secondary | ICD-10-CM

## 2021-07-09 MED ORDER — COLCHICINE 0.6 MG PO TABS: ORAL_TABLET | ORAL | 3 refills | Status: DC

## 2021-07-09 NOTE — Telephone Encounter (Signed)
Pharmacy calls nurse line in regards to Colchicine prescription.   Prescription was sent over with spanish directions.   Please resend prescription with clear instructions.

## 2021-07-09 NOTE — Telephone Encounter (Signed)
Re-ordered colchicine.

## 2021-08-19 DIAGNOSIS — Z7984 Long term (current) use of oral hypoglycemic drugs: Secondary | ICD-10-CM | POA: Diagnosis not present

## 2021-08-19 DIAGNOSIS — M109 Gout, unspecified: Secondary | ICD-10-CM | POA: Diagnosis not present

## 2021-08-19 DIAGNOSIS — E119 Type 2 diabetes mellitus without complications: Secondary | ICD-10-CM | POA: Diagnosis not present

## 2021-08-19 DIAGNOSIS — Z833 Family history of diabetes mellitus: Secondary | ICD-10-CM | POA: Diagnosis not present

## 2021-08-19 DIAGNOSIS — Z823 Family history of stroke: Secondary | ICD-10-CM | POA: Diagnosis not present

## 2021-08-19 DIAGNOSIS — I10 Essential (primary) hypertension: Secondary | ICD-10-CM | POA: Diagnosis not present

## 2021-08-19 DIAGNOSIS — E785 Hyperlipidemia, unspecified: Secondary | ICD-10-CM | POA: Diagnosis not present

## 2021-08-19 DIAGNOSIS — Z8249 Family history of ischemic heart disease and other diseases of the circulatory system: Secondary | ICD-10-CM | POA: Diagnosis not present

## 2021-08-19 DIAGNOSIS — Z6837 Body mass index (BMI) 37.0-37.9, adult: Secondary | ICD-10-CM | POA: Diagnosis not present

## 2021-10-03 ENCOUNTER — Ambulatory Visit: Payer: Medicare HMO

## 2021-10-03 NOTE — Progress Notes (Deleted)
    SUBJECTIVE:   CHIEF COMPLAINT / HPI:  No chief complaint on file.   On chart review, last visit to our office was in July 2020.  PERTINENT  PMH / PSH: HTN, T2DM, HLD, gout  Patient Care Team: [name], DO as PCP - General (Family Medicine)   OBJECTIVE:   There were no vitals taken for this visit.  Physical Exam      08/02/2018   10:48 AM  Depression screen PHQ 2/9  Decreased Interest 0  Down, Depressed, Hopeless 0  PHQ - 2 Score 0     {Show previous vital signs (optional):23777}  {Labs  Heme  Chem  Endocrine  Serology  Results Review (optional):23779}  ASSESSMENT/PLAN:   No problem-specific Assessment & Plan notes found for this encounter.    No follow-ups on file.   Zola Button, MD [place]

## 2021-10-06 ENCOUNTER — Ambulatory Visit (INDEPENDENT_AMBULATORY_CARE_PROVIDER_SITE_OTHER): Payer: Medicare HMO | Admitting: Family Medicine

## 2021-10-06 VITALS — BP 134/78 | HR 80 | Ht 60.0 in | Wt 182.0 lb

## 2021-10-06 DIAGNOSIS — E785 Hyperlipidemia, unspecified: Secondary | ICD-10-CM

## 2021-10-06 DIAGNOSIS — M1A9XX0 Chronic gout, unspecified, without tophus (tophi): Secondary | ICD-10-CM

## 2021-10-06 DIAGNOSIS — R7309 Other abnormal glucose: Secondary | ICD-10-CM

## 2021-10-06 DIAGNOSIS — E119 Type 2 diabetes mellitus without complications: Secondary | ICD-10-CM | POA: Diagnosis not present

## 2021-10-06 DIAGNOSIS — I10 Essential (primary) hypertension: Secondary | ICD-10-CM

## 2021-10-06 DIAGNOSIS — M79644 Pain in right finger(s): Secondary | ICD-10-CM | POA: Diagnosis not present

## 2021-10-06 LAB — POCT GLYCOSYLATED HEMOGLOBIN (HGB A1C): Hemoglobin A1C: 7.5 % — AB (ref 4.0–5.6)

## 2021-10-06 MED ORDER — CEPHALEXIN 500 MG PO CAPS: 500.0000 mg | ORAL_CAPSULE | Freq: Two times a day (BID) | ORAL | 0 refills | Status: AC

## 2021-10-06 NOTE — Patient Instructions (Signed)
It was great seeing you today!  You were seen for your painful and swollen finger, and I am treating it with antibiotics and getting blood work.   If the swelling and pain worsens, you start to develop fevers or any new and concerning symptoms please go to the ED  We have scheduled you to see your PCP in 2 weeks for a physical and go over your blood work results, but if you need to be seen earlier than that for any new issues we're happy to fit you in, just give [place] a call!  Visit Reminders: - Stop by the pharmacy to pick up your prescriptions   Feel free to call with any questions or concerns at any time, at [phone].   Take care,  Dr. Shary Key [facility]   Fue genial verte hoy!  Lo atendieron por su dedo hinchado y dolorido, y lo estoy tratando con antibiticos y hacindome anlisis de [place].  Si la hinchazn y Scientist, research (life sciences), comienza a desarrollar fiebre o cualquier sntoma nuevo y preocupante, vaya al servicio de urgencias.  Hemos programado que vea a su PCP en 2 semanas para un examen fsico y [employer] de sus anlisis de [place], [place] si necesita que lo atiendan antes por cualquier problema nuevo, estaremos encantados de atenderle, simplemente llmenos. !  Recordatorios de visita: - Pasa por la farmacia a recoger tus recetas  Si tiene alguna pregunta o inquietud, no dude en llamarnos en cualquier momento al [phone].   Cuidarse, Dra. [place] familiar [place]

## 2021-10-06 NOTE — Assessment & Plan Note (Signed)
A1c 7.5, up from 6.8 four years ago.  Has been taking metformin 1000 mg twice daily. Did not adjust medication given time constraints but would consider adding GLP1 or SGLT2 in addition to lifestyle modification

## 2021-10-06 NOTE — Progress Notes (Signed)
    SUBJECTIVE:   CHIEF COMPLAINT / HPI:   Patient presents for pain and swelling of her right 5th digit.  Of note she has not been seen in the clinic in over 3 years. Has been worried to come in during the pandemic.   States pain is in the right hand for 1 week consistent with her gout flare which typically occurs in toes, wrist, fingers. Presents with swollen and painful right 5th finger that extends below wrist. No inciting trauma. Wakes up with the pain. Sometimes wakes her up out of her sleep. Initially had pain and swelling in all her fingers of her right hand but now only the 5th finger after using her allopurinol and colchicine but no improvement of the 5th finger. Has not tried icing or using other medications. No fevers, chills or other symptoms.   PERTINENT  PMH / PSH: Gout, DM, HTN, HLD  OBJECTIVE:   BP 134/78   Pulse 80   Ht 5' (1.524 m)   Wt 182 lb (82.6 kg)   SpO2 99%   BMI 35.54 kg/m    Physical exam General: well appearing, NAD Cardiovascular: RRR, no murmurs Lungs: CTAB. Normal WOB Abdomen: soft, non-distended, non-tender Skin: warm, dry. 5th R digit with erythema, edema and tenderness to palpation. Limited ROM 2/2 pain and swelling     ASSESSMENT/PLAN:   Finger pain, right Patient presents with one week of a swollen and painful right 5th finger. Typical of her gout flares and initially had pain in all fingers as well as her toes but everything else improved with her Allopurinol and Colchicine. Potentially infectious and will treat with Keflex 500mg  BID for 7 days. Return precautions discussed.   Essential hypertension BP 134/78. Currently on Lisinopril 10mg  daily. Will continue current medication    Diabetes mellitus (HCC) A1c 7.5, up from 6.8 four years ago.  Has been taking metformin 1000 mg twice daily. Did not adjust medication given time constraints but would consider adding GLP1 or SGLT2 in addition to lifestyle modification   Health  maintenance Given patient has not been seen in over 3 years, will need a follow up visit with PCP for a full physical exam. Did order baseline labs such as CBC, CMP, lipid panel, and uric acid given recent gout flare to have prior to PCP visit.   [place]

## 2021-10-06 NOTE — Assessment & Plan Note (Signed)
BP 134/78. Currently on Lisinopril 10mg  daily. Will continue current medication

## 2021-10-06 NOTE — Assessment & Plan Note (Signed)
Patient presents with one week of a swollen and painful right 5th finger. Typical of her gout flares and initially had pain in all fingers as well as her toes but everything else improved with her Allopurinol and Colchicine. Potentially infectious and will treat with Keflex 500mg  BID for 7 days. Return precautions discussed.

## 2021-10-07 LAB — COMPREHENSIVE METABOLIC PANEL
ALT: 19 IU/L (ref 0–32)
AST: 20 IU/L (ref 0–40)
Albumin/Globulin Ratio: 1.2 (ref 1.2–2.2)
Albumin: 4.2 g/dL (ref 3.8–4.8)
Alkaline Phosphatase: 95 IU/L (ref 44–121)
BUN/Creatinine Ratio: 23 (ref 12–28)
BUN: 26 mg/dL (ref 8–27)
Bilirubin Total: 0.5 mg/dL (ref 0.0–1.2)
CO2: 20 mmol/L (ref 20–29)
Calcium: 9.4 mg/dL (ref 8.7–10.3)
Chloride: 105 mmol/L (ref 96–106)
Creatinine, Ser: 1.11 mg/dL — ABNORMAL HIGH (ref 0.57–1.00)
Globulin, Total: 3.4 g/dL (ref 1.5–4.5)
Glucose: 106 mg/dL — ABNORMAL HIGH (ref 70–99)
Potassium: 5.1 mmol/L (ref 3.5–5.2)
Sodium: 140 mmol/L (ref 134–144)
Total Protein: 7.6 g/dL (ref 6.0–8.5)
eGFR: 52 mL/min/{1.73_m2} — ABNORMAL LOW (ref >59)

## 2021-10-07 LAB — CBC
Hematocrit: 40.1 % (ref 34.0–46.6)
Hemoglobin: 13.2 g/dL (ref 11.1–15.9)
MCH: 28.9 pg (ref 26.6–33.0)
MCHC: 32.9 g/dL (ref 31.5–35.7)
MCV: 88 fL (ref 79–97)
Platelets: 216 10*3/uL (ref 150–450)
RBC: 4.56 x10E6/uL (ref 3.77–5.28)
RDW: 14.1 % (ref 11.7–15.4)
WBC: 6.2 10*3/uL (ref 3.4–10.8)

## 2021-10-07 LAB — LIPID PANEL
Chol/HDL Ratio: 2.8 ratio (ref 0.0–4.4)
Cholesterol, Total: 164 mg/dL (ref 100–199)
HDL: 58 mg/dL (ref >39)
LDL Chol Calc (NIH): 79 mg/dL (ref 0–99)
Triglycerides: 156 mg/dL — ABNORMAL HIGH (ref 0–149)
VLDL Cholesterol Cal: 27 mg/dL (ref 5–40)

## 2021-10-07 LAB — URIC ACID: Uric Acid: 10.3 mg/dL — ABNORMAL HIGH (ref 3.1–7.9)

## 2021-10-20 ENCOUNTER — Other Ambulatory Visit: Payer: Self-pay

## 2021-10-20 ENCOUNTER — Encounter: Payer: Self-pay | Admitting: Student

## 2021-10-20 ENCOUNTER — Ambulatory Visit (INDEPENDENT_AMBULATORY_CARE_PROVIDER_SITE_OTHER): Payer: Medicare HMO | Admitting: Student

## 2021-10-20 VITALS — BP 169/84 | HR 68 | Wt 183.0 lb

## 2021-10-20 DIAGNOSIS — I10 Essential (primary) hypertension: Secondary | ICD-10-CM

## 2021-10-20 DIAGNOSIS — E119 Type 2 diabetes mellitus without complications: Secondary | ICD-10-CM | POA: Diagnosis not present

## 2021-10-20 DIAGNOSIS — Z0000 Encounter for general adult medical examination without abnormal findings: Secondary | ICD-10-CM | POA: Diagnosis not present

## 2021-10-20 DIAGNOSIS — M1A9XX0 Chronic gout, unspecified, without tophus (tophi): Secondary | ICD-10-CM | POA: Diagnosis not present

## 2021-10-20 MED ORDER — ALLOPURINOL 200 MG PO TABS: ORAL_TABLET | ORAL | 0 refills | Status: DC

## 2021-10-20 NOTE — Progress Notes (Signed)
    SUBJECTIVE:   Chief compliant/HPI: annual examination  Laura Aguirre is a 76 y.o. who presents today for an annual exam.   Type 2 Diabetes: Home medications include: Metformin 1000 mg twice daily. Does endorse compliance but does not always take metformin BID.  Most recent A1Cs:  Lab Results  Component Value Date   HGBA1C 7.5 (A) 10/06/2021   HGBA1C 7.7 (A) 06/30/2018  Last Microalbumin, LDL, Creatinine: Lab Results  Component Value Date   LDLCALC 79 10/06/2021   CREATININE 1.11 (H) 10/06/2021    Patient is not up to date on diabetic eye. Patient is not up to date on diabetic foot exam.   History tabs reviewed and updated.   No history of smoking, alcohol. Not concerned for STDs. Denies family hx of breast cancer or other cancers.   OBJECTIVE:   BP (!) 169/84   Pulse 68   Wt 183 lb (83 kg)   SpO2 100%   BMI 35.74 kg/m   General: Appears at stated age, NAD, appropriate in conversation CV: RRR, no murmurs auscultated Pulm: CTAB, normal WOB  ASSESSMENT/PLAN:  Encounter for annual physical exam Assessment & Plan: Not interested in STD screening.  Patient declined breast cancer and colorectal cancer screening.  Likely patient does not smoke or drink alcohol and does not have any family history of breast cancer or other cancers.  Lung cancer screening not indicated.   Type 2 diabetes mellitus without complication, without long-term current use of insulin (Laura Aguirre) Assessment & Plan: A1c on 9/25 7.5.  Has not been fully compliant with metformin and only taking metformin 1-2 times a day.  Advised taking as prescribed 1000 mg twice daily.  Ophthalmology referral for diabetic eye exam placed.  Will obtain urine creatinine and foot exam at next visit.  Orders: -     Ambulatory referral to Ophthalmology  Chronic gout without tophus, unspecified cause, unspecified site Assessment & Plan: Uric acid level 10.3 on 10/07/2021.  Increase allopurinol to 200 mg daily.  Advised  patient to take 2 tablets of current 100 mg medication and then when she picks up new prescription to take 1 tablet as noted on prescription bottle.  She stated understanding of plan.  Discussed further increases of allopurinol dosage at later follow-ups.  Orders: -     Allopurinol; TOME UNA TABLETA UNA VEZ AL DIA POR VIA ORAL  Dispense: 90 tablet; Refill: 0  Essential hypertension Assessment & Plan: Elevated and poorly controlled, compliant on lisinopril 10 mg daily.  Return to discuss this further and will likely need multiple medications on board, consider combo pill Zestoretic.   Annual Examination  See AVS for age appropriate recommendations  PHQ score 0, reviewed and discussed.  BP reviewed and not at goal. Opted to discuss further at next visit.   Considered the following items based upon USPSTF recommendations: Diabetes screening: discussed Screening for elevated cholesterol: discussed HIV testing:  previously completed Hepatitis C:  previously completed Hepatitis B: discussed Syphilis if at high risk: discussed GC/CT not at high risk and not ordered. Reviewed risk factors for latent tuberculosis and not indicated  Discussed family history, BRCA testing not indicated. Breast cancer screening: declined Colorectal cancer screening: discussed and declined today.  Lung cancer screening:  not indicated . See documentation below regarding indications/risks/benefits.  Vaccinations none.   Return in about 4 weeks (around 11/17/2021) for gout f/u and diabetic foot exam, HTN.   [name], [place]

## 2021-10-20 NOTE — Patient Instructions (Addendum)
It was great to see you today! Thank you for choosing [facility] for your primary care. Laura Aguirre was seen for physical, gout.  Today we addressed: Gout: We are increasing your allopurinol to 200 mg daily.  We will recheck uric acid after several weeks of taking this. For your hand pain: Your hand may be more sensitive to weather changes.  You have excellent pulses and I do not see any abnormalities.  I would recommend wearing a glove and keeping hands moisturized for this. Diabetes: I am referring you to an eye doctor who specifically will examine your eyes and how it is being affected by diabetes. Things to do to Keep yourself Healthy - Exercise at least 30-45 minutes a day, 3-4 days a week. >150 min of moderate intensity per week is advised. - Eat a low-fat diet with lots of fruits and vegetables, up to 7-9 servings per day. - Seatbelts can save your life. Wear them always. - Smoke detectors on every level of your home, check batteries every year. - Eye Doctor - have an eye exam every 1-2 years - Safe sex - if you may be exposed to STDs, use a condom. - Alcohol If you drink, do it moderately, less than 1 drink per day. - Health Care Power of Attorney.  Choose someone to speak for you if you are not able. - Depression is common in our stressful world.If you're feeling down or losing interest in things you normally enjoy, please come in for a visit. - Violence - If anyone is threatening or hurting you, please call immediately.   If you haven't already, sign up for My Chart to have easy access to your labs results, and communication with your primary care physician.  Call the clinic at [phone] if your symptoms worsen or you have any concerns.  You should return to our clinic Return in about 4 weeks (around 11/17/2021) for gout f/u and diabetic foot exam. Please arrive 15 minutes before your appointment to ensure smooth check in process.  We appreciate your efforts in  making this happen.  Thank you for allowing me to participate in your care, Laura Mattocks, DO 10/20/2021, 2:43 PM PGY-2, [facility]   Fue genial verte hoy! Laura Aguirre por elegir [facility] para su atencin primaria. [name] fue vista por problemas fsicos, gota.  Hoy abordamos: 1. Gota: Estamos aumentando su alopurinol a 200 mg al da. Volveremos a Sales executive cido rico despus de varias semanas de tomarlo. 2. Para el dolor de tus manos: Tu mano puede ser ms sensible a los cambios climticos. Tienes pulsos excelentes y no veo ninguna anormalidad. Recomendara usar guantes y [employer] manos hidratadas para ello. 3. Diabetes: lo remito a un oftalmlogo que examinar especficamente sus ojos y cmo los afecta la diabetes. Cosas que hacer para mantenerse saludable - Haga ejercicio al menos 30-45 minutos al da, 3-4 das a la [place]. Se recomienda >150 min de intensidad moderada por semana. - Consuma una dieta baja en grasas con muchas frutas y verduras, hasta 7-9 porciones por Futures trader. - Los cinturones de seguridad pueden salvarle la vida. Llvalos siempre. - Detectores de humo en cada nivel de tu hogar, revisa las bateras cada ao. - Oftalmlogo: hgase un examen de la vista cada 1 o 2 aos - Sexo seguro: si puede estar expuesto a una ETS, utilice condn. - Alcohol Si bebes, hazlo con moderacin, menos de 1 trago al da. - Poder notarial para  la atencin de [name]	[place]. Elija a alguien que hable por usted si no puede hacerlo. - La depresin es comn en nuestro mundo estresante. Si se siente deprimido o est perdiendo inters en las cosas que normalmente disfruta, venga a visitarnos. - Violencia: si alguien lo amenaza o lastima, llame de inmediato.  Si an no lo ha hecho, regstrese en My Chart para tener fcil acceso a los resultados de sus laboratorios y Equities trader con su mdico de Midwife.  Llame a la clnica al [phone] si sus sntomas  empeoran o tiene alguna inquietud.  Debe regresar a Administrator en aproximadamente 4 semanas (alrededor del 06/22/2021) para un examen de gota y pie diabtico.

## 2021-10-21 ENCOUNTER — Other Ambulatory Visit (HOSPITAL_COMMUNITY): Payer: Self-pay

## 2021-10-21 ENCOUNTER — Telehealth: Payer: Self-pay

## 2021-10-21 NOTE — Assessment & Plan Note (Addendum)
Uric acid level 10.3 on 10/07/2021.  Increase allopurinol to 200 mg daily.  Advised patient to take 2 tablets of current 100 mg medication and then when she picks up new prescription to take 1 tablet as noted on prescription bottle.  She stated understanding of plan.  Discussed further increases of allopurinol dosage at later follow-ups.

## 2021-10-21 NOTE — Telephone Encounter (Signed)
  Received notification from CovermyMeds that prior authorization is required for Allopurinol 200MG  tablets. Call pharmacy and had them to rerun for 100 mg take 2 one time daily. Prior was not needed the insurance would cover 2 tablet once daily   [facility] Patient Advocate

## 2021-10-21 NOTE — Assessment & Plan Note (Signed)
Elevated and poorly controlled, compliant on lisinopril 10 mg daily.  Return to discuss this further and will likely need multiple medications on board, consider combo pill Zestoretic.

## 2021-10-21 NOTE — Assessment & Plan Note (Signed)
Not interested in STD screening.  Patient declined breast cancer and colorectal cancer screening.  Likely patient does not smoke or drink alcohol and does not have any family history of breast cancer or other cancers.  Lung cancer screening not indicated.

## 2021-10-21 NOTE — Assessment & Plan Note (Addendum)
A1c on 9/25 7.5.  Has not been fully compliant with metformin and only taking metformin 1-2 times a day.  Advised taking as prescribed 1000 mg twice daily.  Ophthalmology referral for diabetic eye exam placed.  Will obtain urine creatinine and foot exam at next visit.

## 2021-11-04 ENCOUNTER — Other Ambulatory Visit: Payer: Self-pay

## 2021-11-04 DIAGNOSIS — E118 Type 2 diabetes mellitus with unspecified complications: Secondary | ICD-10-CM

## 2021-11-04 DIAGNOSIS — M1A9XX0 Chronic gout, unspecified, without tophus (tophi): Secondary | ICD-10-CM

## 2021-11-04 MED ORDER — METFORMIN HCL 1000 MG PO TABS: 1000.0000 mg | ORAL_TABLET | Freq: Two times a day (BID) | ORAL | 3 refills | Status: DC

## 2021-11-04 MED ORDER — ALLOPURINOL 200 MG PO TABS: 200.0000 mg | ORAL_TABLET | Freq: Every day | ORAL | 2 refills | Status: DC

## 2021-11-05 ENCOUNTER — Other Ambulatory Visit: Payer: Self-pay

## 2021-11-05 DIAGNOSIS — I10 Essential (primary) hypertension: Secondary | ICD-10-CM

## 2021-11-06 MED ORDER — LISINOPRIL 10 MG PO TABS: 10.0000 mg | ORAL_TABLET | Freq: Every day | ORAL | 3 refills | Status: DC

## 2021-11-21 ENCOUNTER — Encounter: Payer: Self-pay | Admitting: Student

## 2021-11-21 ENCOUNTER — Ambulatory Visit (INDEPENDENT_AMBULATORY_CARE_PROVIDER_SITE_OTHER): Payer: Medicare HMO | Admitting: Student

## 2021-11-21 ENCOUNTER — Other Ambulatory Visit (HOSPITAL_COMMUNITY): Payer: Self-pay

## 2021-11-21 VITALS — BP 152/79 | HR 68 | Ht 60.0 in | Wt 181.4 lb

## 2021-11-21 DIAGNOSIS — I10 Essential (primary) hypertension: Secondary | ICD-10-CM | POA: Diagnosis not present

## 2021-11-21 DIAGNOSIS — E785 Hyperlipidemia, unspecified: Secondary | ICD-10-CM

## 2021-11-21 DIAGNOSIS — M1A9XX0 Chronic gout, unspecified, without tophus (tophi): Secondary | ICD-10-CM

## 2021-11-21 DIAGNOSIS — E119 Type 2 diabetes mellitus without complications: Secondary | ICD-10-CM | POA: Diagnosis not present

## 2021-11-21 MED ORDER — EMPAGLIFLOZIN 10 MG PO TABS: 10.0000 mg | ORAL_TABLET | Freq: Every day | ORAL | 0 refills | Status: DC

## 2021-11-21 MED ORDER — LOSARTAN POTASSIUM 50 MG PO TABS: 50.0000 mg | ORAL_TABLET | Freq: Every day | ORAL | 3 refills | Status: DC

## 2021-11-21 NOTE — Assessment & Plan Note (Signed)
LDL 79, goal <70.  Recommend switching pravastatin to high intensity statin although already significant med changes made today.  Follow-up at later visit.

## 2021-11-21 NOTE — Progress Notes (Signed)
  SUBJECTIVE:   CHIEF COMPLAINT / HPI:   Type 2 Diabetes: Home medications include: Metformin 1000 mg twice daily. Does endorse compliance.  A1c 7.5 on 9/25. Patient is not up to date on diabetic eye.  Referral has been placed. Patient is not up to date on diabetic foot exam.   Hypertension: BP: (!) 152/79 (Provider informed) today. Home medications include: Lisinopril 10 mg daily. She endorses taking these medications as prescribed. Patient has had a BMP in the past 1 year.  Gout: Allopurinol was increased to 200 mg daily.  Uric acid was 10.3 on 10/07/2021. She notes that she used to get pain in her finger every 8 days and now she only gets that pain once a month. She is happy with the change in her allopurinol.   Spanish video interpreter used throughout encounter.  PERTINENT  PMH / PSH: T2DM, HTN, HLD, gout  OBJECTIVE:  BP (!) 152/79 Comment: Provider informed  Pulse 68   Ht 5' (1.524 m)   Wt 181 lb 6 oz (82.3 kg)   BMI 35.42 kg/m  General: Awake, alert, NAD CV: RRR, no murmurs auscultated Pulm: CTA B, normal WOB  ASSESSMENT/PLAN:  Essential hypertension Assessment & Plan: BP: (!) 152/79 (Provider informed) today. Poorly controlled. Goal of <130/80.  Follow-up in 1 month Medication regimen: Discontinue lisinopril.  Start losartan 50 mg daily.  Check BMP in 1 week.  Orders: -     Losartan Potassium; Take 1 tablet (50 mg total) by mouth at bedtime.  Dispense: 90 tablet; Refill: 3 -     Basic metabolic panel; Future  Chronic gout without tophus, unspecified cause, unspecified site Assessment & Plan: Improved with increased allopurinol dosage.  Check uric acid in 1 week.  Orders: -     Uric acid; Future  Type 2 diabetes mellitus without complication, without long-term current use of insulin (HCC) Assessment & Plan: reasonably well controlled - Last A1c:  Lab Results  Component Value Date   HGBA1C 7.5 (A) 10/06/2021   - Medications: Metformin 1000 mg twice daily,  start Jardiance 10 mg daily, consider increasing to 25 mg at next visit if well-tolerated.  No significant history of UTIs. Ophthalmology referral previously placed.  Obtain UACR today.   Orders: -     Microalbumin / creatinine urine ratio -     Empagliflozin; Take 1 tablet (10 mg total) by mouth daily.  Dispense: 90 tablet; Refill: 0  Hyperlipidemia, unspecified hyperlipidemia type Assessment & Plan: LDL 79, goal <70.  Recommend switching pravastatin to high intensity statin although already significant med changes made today.  Follow-up at later visit.   Return in about 1 month (around 12/21/2021) for Hypertension, diabetes follow-up. [name], DO 11/21/2021, 12:26 PM PGY-2, [facility]

## 2021-11-21 NOTE — Assessment & Plan Note (Addendum)
BP: (!) 152/79 (Provider informed) today. Poorly controlled. Goal of <130/80.  Follow-up in 1 month Medication regimen: Discontinue lisinopril.  Start losartan 50 mg daily.  Check BMP in 1 week.

## 2021-11-21 NOTE — Assessment & Plan Note (Signed)
reasonably well controlled - Last A1c:  Lab Results  Component Value Date   HGBA1C 7.5 (A) 10/06/2021   - Medications: Metformin 1000 mg twice daily, start Jardiance 10 mg daily, consider increasing to 25 mg at next visit if well-tolerated.  No significant history of UTIs. Ophthalmology referral previously placed.  Obtain UACR today.

## 2021-11-21 NOTE — Assessment & Plan Note (Signed)
Improved with increased allopurinol dosage.  Check uric acid in 1 week.

## 2021-11-21 NOTE — Patient Instructions (Addendum)
It was great to see you today! Thank you for choosing [facility] for your primary care. Laura Aguirre was seen for hypertension, gout, diabetes.  Today we addressed: Hypertension: Stop your lisinopril.  Start losartan 50 mg daily.  You will need a BMP checked next week.  Follow-up in 1 month. Gout: I am glad the allopurinol increase is helping.  We will check your uric acid next week at the same time as your BMP. Diabetes: I am starting you on Jardiance 10 mg daily.  I may increase this to 25 mg daily at your next visit if you are tolerating it well.  If you haven't already, sign up for My Chart to have easy access to your labs results, and communication with your primary care physician.  We are checking some labs today. If they are abnormal, I will call you. If they are normal, I will send you a MyChart message (if it is active) or a letter in the mail. If you do not hear about your labs in the next 2 weeks, please call the office. I recommend that you always bring your medications to each appointment as this makes it easy to ensure you are on the correct medications and helps [place] not miss refills when you need them. Call the clinic at [phone] if your symptoms worsen or you have any concerns.  You should return to our clinic Return in about 1 month (around 12/21/2021) for Hypertension, diabetes follow-up. Please arrive 15 minutes before your appointment to ensure smooth check in process.  We appreciate your efforts in making this happen.  Thank you for allowing me to participate in your care, [name], DO 11/21/2021, 11:01 AM PGY-2, [facility]

## 2021-11-23 LAB — MICROALBUMIN / CREATININE URINE RATIO
Creatinine, Urine: 75.1 mg/dL
Microalb/Creat Ratio: 73 mg/g creat — ABNORMAL HIGH (ref 0–29)
Microalbumin, Urine: 54.8 ug/mL

## 2021-11-25 ENCOUNTER — Encounter: Payer: Self-pay | Admitting: Student

## 2021-11-26 ENCOUNTER — Other Ambulatory Visit (HOSPITAL_COMMUNITY): Payer: Self-pay

## 2021-11-28 ENCOUNTER — Other Ambulatory Visit: Payer: Medicare HMO

## 2021-12-01 ENCOUNTER — Other Ambulatory Visit: Payer: Medicare HMO

## 2021-12-01 DIAGNOSIS — I10 Essential (primary) hypertension: Secondary | ICD-10-CM | POA: Diagnosis not present

## 2021-12-01 DIAGNOSIS — M1A9XX0 Chronic gout, unspecified, without tophus (tophi): Secondary | ICD-10-CM

## 2021-12-02 LAB — BASIC METABOLIC PANEL
BUN/Creatinine Ratio: 28 (ref 12–28)
BUN: 30 mg/dL — ABNORMAL HIGH (ref 8–27)
CO2: 22 mmol/L (ref 20–29)
Calcium: 9 mg/dL (ref 8.7–10.3)
Chloride: 100 mmol/L (ref 96–106)
Creatinine, Ser: 1.08 mg/dL — ABNORMAL HIGH (ref 0.57–1.00)
Glucose: 184 mg/dL — ABNORMAL HIGH (ref 70–99)
Potassium: 4.8 mmol/L (ref 3.5–5.2)
Sodium: 135 mmol/L (ref 134–144)
eGFR: 54 mL/min/{1.73_m2} — ABNORMAL LOW (ref >59)

## 2021-12-02 LAB — URIC ACID: Uric Acid: 6.1 mg/dL (ref 3.1–7.9)

## 2021-12-03 ENCOUNTER — Telehealth: Payer: Self-pay | Admitting: Student

## 2021-12-03 DIAGNOSIS — N183 Chronic kidney disease, stage 3 unspecified: Secondary | ICD-10-CM | POA: Insufficient documentation

## 2021-12-03 DIAGNOSIS — N1831 Chronic kidney disease, stage 3a: Secondary | ICD-10-CM

## 2021-12-03 NOTE — Telephone Encounter (Signed)
Attempted to reach patient regarding lab results and her requesting to speak with me because her London Pepper was too expensive and she told our lab that "she does not know why she was started on it". Interpreter used for several attempts. Home phone, left VM. Work phone invalid number.   Labs: kidney function appropriate. Glucose high, working on better glucose control which is the overall goal of Jardiance in addition to kidney protection. She is now classified as stage 3A kidney disease. Has appointment on 12/18. I will discuss further with her then unless she calls back.

## 2021-12-28 NOTE — Progress Notes (Unsigned)
  SUBJECTIVE:   CHIEF COMPLAINT / HPI:   Hypertension: BP: 136/84 today. Home medications include: Losartan 50 mg daily. She endorses taking these medications as prescribed. Patient has had a BMP in the past 1 year.  Type 2 Diabetes: Home medications include: metformin 1000mg  BID, Jardiance 10mg  daily. Does endorse compliance with Metformin but the Jardiance is too expensive for her (about $250/month). She does not check her sugar at home.   Patient is not up to date on diabetic eye. Patient is not up to date on diabetic foot exam.   Gout: She had a recent gout flare which was helped with colchicine.   Spanish video interpreter used throughout encounter.  PERTINENT  PMH / PSH: T2DM, HTN, HLD, gout  OBJECTIVE:  BP 136/84   Pulse 81   Ht 5' (1.524 m)   Wt 182 lb (82.6 kg)   SpO2 97%   BMI 35.54 kg/m  General: Awake, alert, NAD CV: RRR, no murmurs auscultated  ASSESSMENT/PLAN:  Essential hypertension Assessment & Plan: BP: 136/84 today.  Improved control . Goal of <130/80.  Medication regimen: Increase losartan to 100 mg daily starting 01/08/2022.  Recheck BMP on 01/16/2021, lab appointment scheduled.  Orders: -     Basic metabolic panel; Future -     Losartan Potassium; Take 1 tablet (100 mg total) by mouth at bedtime.  Dispense: 90 tablet; Refill: 3  Type 2 diabetes mellitus without complication, without long-term current use of insulin (HCC) Assessment & Plan: Continue metformin 1000 mg twice daily.  Unfortunately 01/10/2022 is too expensive even though she would greatly benefit from the glucose control and renal protective measures.  Urine creatinine showed microalbuminuria although her losartan has now been increased twice which should help with this.  I will reach out to clinical pharmacy tech to see if we can get medication financial assistance regarding the Jardiance.  Recheck A1c at lab appointment on 01/16/2021.  Orders: -     POCT glycosylated hemoglobin (Hb A1C);  Future  Chronic gout without tophus, unspecified cause, unspecified site Assessment & Plan: Uric acid recently 6.1.  I am surprised she had another gout flare although frequency is decreased.  Increase in losartan should aid uric acid level further.  Plan to recheck uric acid sometime in the next couple months.   Return in about 1 month (around 01/29/2022) for Follow-up (HTN, T2DM, HLD). 03/16/2021, DO 12/29/2021, 3:00 PM PGY-2, [facility]

## 2021-12-29 ENCOUNTER — Encounter: Payer: Self-pay | Admitting: Student

## 2021-12-29 ENCOUNTER — Ambulatory Visit (INDEPENDENT_AMBULATORY_CARE_PROVIDER_SITE_OTHER): Payer: Medicare HMO | Admitting: Student

## 2021-12-29 VITALS — BP 136/84 | HR 81 | Ht 60.0 in | Wt 182.0 lb

## 2021-12-29 DIAGNOSIS — I10 Essential (primary) hypertension: Secondary | ICD-10-CM

## 2021-12-29 DIAGNOSIS — M1A9XX0 Chronic gout, unspecified, without tophus (tophi): Secondary | ICD-10-CM

## 2021-12-29 DIAGNOSIS — E119 Type 2 diabetes mellitus without complications: Secondary | ICD-10-CM

## 2021-12-29 MED ORDER — LOSARTAN POTASSIUM 100 MG PO TABS: 100.0000 mg | ORAL_TABLET | Freq: Every day | ORAL | 3 refills | Status: DC

## 2021-12-29 NOTE — Assessment & Plan Note (Signed)
Continue metformin 1000 mg twice daily.  Unfortunately London Pepper is too expensive even though she would greatly benefit from the glucose control and renal protective measures.  Urine creatinine showed microalbuminuria although her losartan has now been increased twice which should help with this.  I will reach out to clinical pharmacy tech to see if we can get medication financial assistance regarding the Jardiance.  Recheck A1c at lab appointment on 01/16/2021.

## 2021-12-29 NOTE — Patient Instructions (Addendum)
It was great to see you today! Thank you for choosing [facility] for your primary care. Laura Aguirre was seen for high blood pressure, diabetes.  Today we addressed: Blood pressure: I am increasing your losartan to 100 mg daily.  Please do not take this until 01/08/2022 because we will be checking your lab on 01/16/2021.  This medication increase should also help with your gout. Diabetes: Has started the Jardiance is too expensive.  I will contact my pharmacy group here to see what we can do with helping you with this.  If you haven't already, sign up for My Chart to have easy access to your labs results, and communication with your primary care physician.  I will let you know when follow-up would be necessary. Please arrive 15 minutes before your appointment to ensure smooth check in process.  We appreciate your efforts in making this happen.  Thank you for allowing me to participate in your care, [name], DO 12/29/2021, 1:54 PM PGY-2, [facility]

## 2021-12-29 NOTE — Assessment & Plan Note (Addendum)
BP: 136/84 today.  Improved control . Goal of <130/80.  Medication regimen: Increase losartan to 100 mg daily starting 01/08/2022.  Recheck BMP on 01/16/2021, lab appointment scheduled.

## 2021-12-29 NOTE — Assessment & Plan Note (Signed)
Uric acid recently 6.1.  I am surprised she had another gout flare although frequency is decreased.  Increase in losartan should aid uric acid level further.  Plan to recheck uric acid sometime in the next couple months.

## 2022-01-01 ENCOUNTER — Other Ambulatory Visit (HOSPITAL_COMMUNITY): Payer: Self-pay

## 2022-01-02 ENCOUNTER — Other Ambulatory Visit (HOSPITAL_COMMUNITY): Payer: Self-pay

## 2022-01-07 ENCOUNTER — Telehealth: Payer: Self-pay

## 2022-01-07 NOTE — Telephone Encounter (Signed)
Left message requesting call back regarding assistance options for Jardiance medication

## 2022-01-16 ENCOUNTER — Other Ambulatory Visit: Payer: Self-pay | Admitting: Student

## 2022-01-16 ENCOUNTER — Other Ambulatory Visit (INDEPENDENT_AMBULATORY_CARE_PROVIDER_SITE_OTHER): Payer: Medicare HMO

## 2022-01-16 DIAGNOSIS — E119 Type 2 diabetes mellitus without complications: Secondary | ICD-10-CM

## 2022-01-16 DIAGNOSIS — I10 Essential (primary) hypertension: Secondary | ICD-10-CM | POA: Diagnosis not present

## 2022-01-16 DIAGNOSIS — M1A9XX0 Chronic gout, unspecified, without tophus (tophi): Secondary | ICD-10-CM

## 2022-01-16 LAB — POCT GLYCOSYLATED HEMOGLOBIN (HGB A1C): HbA1c, POC (controlled diabetic range): 7.7 % — AB (ref 0.0–7.0)

## 2022-01-16 MED ORDER — COLCHICINE 0.6 MG PO TABS: ORAL_TABLET | ORAL | 3 refills | Status: DC

## 2022-01-17 LAB — BASIC METABOLIC PANEL
BUN/Creatinine Ratio: 20 (ref 12–28)
BUN: 27 mg/dL (ref 8–27)
CO2: 19 mmol/L — ABNORMAL LOW (ref 20–29)
Calcium: 9.4 mg/dL (ref 8.7–10.3)
Chloride: 104 mmol/L (ref 96–106)
Creatinine, Ser: 1.32 mg/dL — ABNORMAL HIGH (ref 0.57–1.00)
Glucose: 138 mg/dL — ABNORMAL HIGH (ref 70–99)
Potassium: 5.2 mmol/L (ref 3.5–5.2)
Sodium: 140 mmol/L (ref 134–144)
eGFR: 42 mL/min/{1.73_m2} — ABNORMAL LOW (ref >59)

## 2022-01-21 DIAGNOSIS — H5203 Hypermetropia, bilateral: Secondary | ICD-10-CM | POA: Diagnosis not present

## 2022-01-21 DIAGNOSIS — H524 Presbyopia: Secondary | ICD-10-CM | POA: Diagnosis not present

## 2022-01-21 DIAGNOSIS — H52223 Regular astigmatism, bilateral: Secondary | ICD-10-CM | POA: Diagnosis not present

## 2022-01-21 DIAGNOSIS — E119 Type 2 diabetes mellitus without complications: Secondary | ICD-10-CM | POA: Diagnosis not present

## 2022-01-21 DIAGNOSIS — Z135 Encounter for screening for eye and ear disorders: Secondary | ICD-10-CM | POA: Diagnosis not present

## 2022-01-21 DIAGNOSIS — H2513 Age-related nuclear cataract, bilateral: Secondary | ICD-10-CM | POA: Diagnosis not present

## 2022-01-24 ENCOUNTER — Other Ambulatory Visit: Payer: Self-pay | Admitting: Student

## 2022-01-24 DIAGNOSIS — M1A9XX0 Chronic gout, unspecified, without tophus (tophi): Secondary | ICD-10-CM

## 2022-01-27 NOTE — Progress Notes (Signed)
  SUBJECTIVE:   CHIEF COMPLAINT / HPI:   Hypertension: BP: 136/64 today. Home medications include: Losartan 100 mg daily. She endorses taking these medications as prescribed but was also taking her 50mg  losartan.  Most recent creatinine trend:  Lab Results  Component Value Date   CREATININE 1.32 (H) 01/16/2022   CREATININE 1.08 (H) 12/01/2021   CREATININE 1.11 (H) 10/06/2021   Type 2 Diabetes: Home medications include: metformin 1000mg  BID. Does endorse compliance. She is unaware of receiving a phone call about the Jardiance.  She states calling her husband Royetta Car would be better since he speaks [place] and he is more reliable with picking up the phone.  Most recent A1Cs:  Lab Results  Component Value Date   HGBA1C 7.7 (A) 01/16/2022   HGBA1C 7.5 (A) 10/06/2021   Patient is not up to date on diabetic eye. Patient is not up to date on diabetic foot exam.   Spanish interpreter used throughout encounter.   PERTINENT  PMH / PSH: T2DM, HTN, HLD, gout  OBJECTIVE:  BP 136/64   Pulse 76   Wt 181 lb (82.1 kg)   SpO2 98%   BMI 35.35 kg/m  Gen: awake, alert, NAD Pulm: normal WOB  ASSESSMENT/PLAN:  Essential hypertension Assessment & Plan: Good control on losartan 100 mg daily however patient endorses taking 150 mg daily.  I suspect this is why her creatinine bumped (although not to levels of AKI) and potassium bump to high normal.  I have taken her losartan 50 mg supply and she understood she is only to take 100 mg daily.  Recheck BMP at next visit.   Type 2 diabetes mellitus without complication, without long-term current use of insulin (HCC) Assessment & Plan: Continue metformin 1000 mg twice daily.  Will message clinical pharmacy tech to get back in touch with patient regarding assistance with Jardiance and to call her husband as he is more reliable with picking up the phone and speaks [place].  Recheck A1c in 3 months.   Chronic gout without tophus, unspecified cause,  unspecified site Assessment & Plan: She has been taking allopurinol 200 mg daily and colchicine as needed.  I am hopeful Vania Rea will be useful for her uric acid levels.  Recheck uric acid at next visit.   Return in about 3 months (around 04/29/2022) for Diabetes follow-up. [name], DO 01/28/2022, 1:36 PM PGY-2, [place]

## 2022-01-28 ENCOUNTER — Ambulatory Visit (INDEPENDENT_AMBULATORY_CARE_PROVIDER_SITE_OTHER): Payer: Medicare HMO | Admitting: Student

## 2022-01-28 ENCOUNTER — Encounter: Payer: Self-pay | Admitting: Student

## 2022-01-28 VITALS — BP 136/64 | HR 76 | Wt 181.0 lb

## 2022-01-28 DIAGNOSIS — I10 Essential (primary) hypertension: Secondary | ICD-10-CM | POA: Diagnosis not present

## 2022-01-28 DIAGNOSIS — M1A9XX0 Chronic gout, unspecified, without tophus (tophi): Secondary | ICD-10-CM

## 2022-01-28 DIAGNOSIS — E119 Type 2 diabetes mellitus without complications: Secondary | ICD-10-CM | POA: Diagnosis not present

## 2022-01-28 NOTE — Patient Instructions (Signed)
It was great to see you today! Thank you for choosing [facility] for your primary care. Laura Aguirre was seen for hypertension, diabetes, gout.  Today we addressed: Hypertension: Your blood pressure looks great.  You should only be taking 100 mg of losartan that is why I took your other medication bottle.  Your labs looked okay.  We will likely recheck them next time you come in. Diabetes: Your A1c did not improve.  I am going to see if we can have our clinical pharmacy tech reach out again about your Jardiance as I do think this is a medication that would benefit you greatly. Gout: Please take the allopurinol every day and you may only take the colchicine when you have a flare.  If you haven't already, sign up for My Chart to have easy access to your labs results, and communication with your primary care physician.  I recommend that you always bring your medications to each appointment as this makes it easy to ensure you are on the correct medications and helps [place] not miss refills when you need them.  You should return to our clinic Return in about 3 months (around 04/29/2022). Please arrive 15 minutes before your appointment to ensure smooth check in process.  We appreciate your efforts in making this happen.  Thank you for allowing me to participate in your care, Laura Guiles, DO 01/28/2022, 10:20 AM PGY-2, [place]

## 2022-01-28 NOTE — Assessment & Plan Note (Addendum)
Good control on losartan 100 mg daily however patient endorses taking 150 mg daily.  I suspect this is why her creatinine bumped (although not to levels of AKI) and potassium bump to high normal.  I have taken her losartan 50 mg supply and she understood she is only to take 100 mg daily.  Recheck BMP at next visit.

## 2022-01-28 NOTE — Assessment & Plan Note (Signed)
She has been taking allopurinol 200 mg daily and colchicine as needed.  I am hopeful Laura Aguirre will be useful for her uric acid levels.  Recheck uric acid at next visit.

## 2022-01-28 NOTE — Assessment & Plan Note (Signed)
Continue metformin 1000 mg twice daily.  Will message clinical pharmacy tech to get back in touch with patient regarding assistance with Jardiance and to call her husband as he is more reliable with picking up the phone and speaks [place].  Recheck A1c in 3 months.

## 2022-02-03 NOTE — Telephone Encounter (Signed)
Attempted to call husband. No voicemail available.

## 2022-02-09 ENCOUNTER — Ambulatory Visit: Payer: Medicare HMO | Admitting: Student

## 2022-04-26 NOTE — Progress Notes (Unsigned)
  SUBJECTIVE:   CHIEF COMPLAINT / HPI:   Hypertension: BP: 118/62 today. Home medications include: losartan 100mg  daily. She endorses taking these medications as prescribed.  Most recent creatinine trend:  Lab Results  Component Value Date   CREATININE 1.32 (H) 01/16/2022   CREATININE 1.08 (H) 12/01/2021   CREATININE 1.11 (H) 10/06/2021   Patient has had a BMP in the past 1 year.  Type 2 Diabetes: Home medications include: Metformin 1000mg  BID. She has not received any messages about Jardiance. Does endorse compliance.   Patient is not up to date on diabetic eye. Patient is not up to date on diabetic foot exam.  Notes she was seen by an eye doctor in [place] sometime in the last few months but she is unsure if a diabetic eye exam was performed.  Gout: she states gout is under control with Allopurinol dosage. She has not had a gout flare in the last 1.5 months.  PERTINENT  PMH / PSH: T2DM, HLD, HTN, gout  Patient Care Team: [name], DO as PCP - General (Family Medicine) OBJECTIVE:  BP 118/62   Pulse 82   Wt 183 lb 3.2 oz (83.1 kg)   SpO2 98%   BMI 35.78 kg/m  Gen: well appearing, NAD CV: RRR, no murmurs auscultated Pulm: normal WOB  ASSESSMENT/PLAN:  Type 2 diabetes mellitus without complication, without long-term current use of insulin Assessment & Plan: needs improvement - Last A1c: Goal 7-8% Lab Results  Component Value Date   HGBA1C 7.7 (A) 04/27/2022  - Medications: Metformin 1000 mg twice daily, sent message to pharmacy tech regarding financial assistance for [facility] -Ophthalmology referral discussed and placed for diabetic eye exam  Orders: -     POCT glycosylated hemoglobin (Hb A1C) -     Ambulatory referral to Ophthalmology  Essential hypertension Assessment & Plan: BP: 118/62 today. Well controlled. Goal of <130/80. Continue to work on healthy dietary habits and exercise.  Medication regimen: Losartan 100 mg daily  Orders: -     Basic  metabolic panel  Hyperlipidemia, unspecified hyperlipidemia type Assessment & Plan: Recheck lipid panel today.  She is not interested on being on her cholesterol medication while making dietary changes, I have counseled otherwise.  Orders: -     Lipid panel  Chronic gout without tophus, unspecified cause, unspecified site Assessment & Plan: Greatly improved on allopurinol 200 mg daily.  Will check uric acid at this visit and hopefully future Rx of Jardiance would provide even further improvement.  Orders: -     Uric acid  Stage 3a chronic kidney disease Assessment & Plan: Discussed.  May recommend nephrology referral depending on next of labs.   Return in about 3 months (around 07/27/2022) for Diabetes. [name], DO 04/27/2022, 2:36 PM PGY-2, [facility]

## 2022-04-27 ENCOUNTER — Encounter: Payer: Self-pay | Admitting: Student

## 2022-04-27 ENCOUNTER — Ambulatory Visit (INDEPENDENT_AMBULATORY_CARE_PROVIDER_SITE_OTHER): Payer: Medicare HMO | Admitting: Student

## 2022-04-27 VITALS — BP 118/62 | HR 82 | Wt 183.2 lb

## 2022-04-27 DIAGNOSIS — E785 Hyperlipidemia, unspecified: Secondary | ICD-10-CM | POA: Diagnosis not present

## 2022-04-27 DIAGNOSIS — N1831 Chronic kidney disease, stage 3a: Secondary | ICD-10-CM | POA: Diagnosis not present

## 2022-04-27 DIAGNOSIS — E119 Type 2 diabetes mellitus without complications: Secondary | ICD-10-CM

## 2022-04-27 DIAGNOSIS — I10 Essential (primary) hypertension: Secondary | ICD-10-CM | POA: Diagnosis not present

## 2022-04-27 DIAGNOSIS — M1A9XX0 Chronic gout, unspecified, without tophus (tophi): Secondary | ICD-10-CM | POA: Diagnosis not present

## 2022-04-27 LAB — POCT GLYCOSYLATED HEMOGLOBIN (HGB A1C): HbA1c, POC (controlled diabetic range): 7.7 % — AB (ref 0.0–7.0)

## 2022-04-27 NOTE — Assessment & Plan Note (Signed)
Discussed.  May recommend nephrology referral depending on next of labs.

## 2022-04-27 NOTE — Assessment & Plan Note (Addendum)
needs improvement - Last A1c: Goal 7-8% Lab Results  Component Value Date   HGBA1C 7.7 (A) 04/27/2022  - Medications: Metformin 1000 mg twice daily, sent message to pharmacy tech regarding financial assistance for [facility] -Ophthalmology referral discussed and placed for diabetic eye exam

## 2022-04-27 NOTE — Assessment & Plan Note (Signed)
Recheck lipid panel today.  She is not interested on being on her cholesterol medication while making dietary changes, I have counseled otherwise.

## 2022-04-27 NOTE — Assessment & Plan Note (Signed)
Greatly improved on allopurinol 200 mg daily.  Will check uric acid at this visit and hopefully future Rx of Jardiance would provide even further improvement.

## 2022-04-27 NOTE — Assessment & Plan Note (Signed)
BP: 118/62 today. Well controlled. Goal of <130/80. Continue to work on healthy dietary habits and exercise.  Medication regimen: Losartan 100 mg daily

## 2022-04-27 NOTE — Patient Instructions (Addendum)
It was great to see you today! Thank you for choosing [facility] for your primary care. Laura Aguirre was seen for blood pressure and diabetes.  Today we addressed: I am glad you are blood pressure and gout are so much better. Your diabetes could be better controlled.  I will have my pharmacy tech reach out to you regarding medication assistance for Jardiance. Cholesterol: I am checking this today.  It is always recommended to be on a cholesterol medication if you have diabetes and while you are making dietary changes I would still recommend taking your cholesterol medication.  If you haven't already, sign up for My Chart to have easy access to your labs results, and communication with your primary care physician.  We are checking some labs today. If they are abnormal, I will call you. If they are normal, I will send you a MyChart message (if it is active) or a letter in the mail. If you do not hear about your labs in the next 2 weeks, please call the office. I recommend that you always bring your medications to each appointment as this makes it easy to ensure you are on the correct medications and helps [place] not miss refills when you need them.  You should return to our clinic Return in about 3 months (around 07/27/2022) for Diabetes. Please arrive 15 minutes before your appointment to ensure smooth check in process.  We appreciate your efforts in making this happen.  Thank you for allowing me to participate in your care, [name], DO 04/27/2022, 1:59 PM PGY-2, [facility]

## 2022-04-28 ENCOUNTER — Encounter (HOSPITAL_COMMUNITY): Payer: Self-pay | Admitting: Student

## 2022-04-28 ENCOUNTER — Telehealth (HOSPITAL_COMMUNITY): Payer: Self-pay | Admitting: Student

## 2022-04-28 LAB — LIPID PANEL
Chol/HDL Ratio: 3.1 ratio (ref 0.0–4.4)
Cholesterol, Total: 180 mg/dL (ref 100–199)
HDL: 59 mg/dL (ref >39)
LDL Chol Calc (NIH): 84 mg/dL (ref 0–99)
Triglycerides: 221 mg/dL — ABNORMAL HIGH (ref 0–149)
VLDL Cholesterol Cal: 37 mg/dL (ref 5–40)

## 2022-04-28 LAB — BASIC METABOLIC PANEL WITH GFR
BUN/Creatinine Ratio: 27 (ref 12–28)
BUN: 35 mg/dL — ABNORMAL HIGH (ref 8–27)
CO2: 19 mmol/L — ABNORMAL LOW (ref 20–29)
Calcium: 8.5 mg/dL — ABNORMAL LOW (ref 8.7–10.3)
Chloride: 103 mmol/L (ref 96–106)
Creatinine, Ser: 1.29 mg/dL — ABNORMAL HIGH (ref 0.57–1.00)
Glucose: 245 mg/dL — ABNORMAL HIGH (ref 70–99)
Potassium: 4.7 mmol/L (ref 3.5–5.2)
Sodium: 137 mmol/L (ref 134–144)
eGFR: 43 mL/min/1.73 — ABNORMAL LOW

## 2022-04-28 LAB — URIC ACID: Uric Acid: 6.6 mg/dL (ref 3.1–7.9)

## 2022-04-28 NOTE — Telephone Encounter (Signed)
Called patient utilizing Spanish interpreter services.  Left voice message regarding lab work being appropriate.  Unable to send letter with appropriate header thereby resorting to phone call for nonconcerning results.  Should patient call back, recommend restarting statin therapy as discussed during clinic visit.

## 2022-04-30 ENCOUNTER — Telehealth: Payer: Self-pay

## 2022-04-30 ENCOUNTER — Other Ambulatory Visit (HOSPITAL_COMMUNITY): Payer: Self-pay

## 2022-04-30 NOTE — Telephone Encounter (Signed)
-----   Message from [name], DO sent at 04/27/2022  1:57 PM EDT ----- Regarding: Jardiance Could you reach out to this patient regarding financial assistance for Jardiance?  Her husband may be easiest to talk with given he speaks [place].  The Jardiance would be beneficial for both her diabetes and her gout.

## 2022-04-30 NOTE — Telephone Encounter (Signed)
Attempted to reach out to husband twice regarding patient assistance for Jardiance. Will go ahead and mail application to patients home.

## 2022-05-12 ENCOUNTER — Other Ambulatory Visit (HOSPITAL_COMMUNITY): Payer: Self-pay

## 2022-05-12 ENCOUNTER — Other Ambulatory Visit: Payer: Self-pay

## 2022-05-12 DIAGNOSIS — M1A9XX0 Chronic gout, unspecified, without tophus (tophi): Secondary | ICD-10-CM

## 2022-05-12 MED ORDER — ALLOPURINOL 200 MG PO TABS
200.0000 mg | ORAL_TABLET | Freq: Every day | ORAL | 2 refills | Status: DC
Start: 2022-05-12 — End: 2022-05-22

## 2022-05-12 NOTE — Telephone Encounter (Signed)
Patients husband called regarding the application for Jardiance.   Per patients husband, Mrs. Langstaff doesn't want to sign the application for assistance. Explained to Mr. Gens that the $47 at the pharmacy is the patients copay & that she is responsible to pay that (he was thinking insurance wasn't covering medication at all), but this company could help her receive it for free. He understood and says she will continue to pay that at the pharmacy. I asked if they were maybe just uncomfortable submitting income, but Mr. Cothron says they both receive social security. He thanked me for the help anyway.

## 2022-05-21 ENCOUNTER — Telehealth: Payer: Self-pay

## 2022-05-21 ENCOUNTER — Other Ambulatory Visit (HOSPITAL_COMMUNITY): Payer: Self-pay

## 2022-05-21 DIAGNOSIS — M1A9XX0 Chronic gout, unspecified, without tophus (tophi): Secondary | ICD-10-CM

## 2022-05-21 NOTE — Telephone Encounter (Signed)
Rec'd prior auth request for patient allopurinol 200mg  tabs.  Insurance covers allopurinol 100mg .  Could an RX for allopurinol 100mg , 2 tablets daily, be sent in?

## 2022-05-22 ENCOUNTER — Other Ambulatory Visit (HOSPITAL_COMMUNITY): Payer: Self-pay

## 2022-05-22 MED ORDER — ALLOPURINOL 100 MG PO TABS
200.0000 mg | ORAL_TABLET | Freq: Every day | ORAL | 3 refills | Status: DC
Start: 2022-05-22 — End: 2023-03-16

## 2022-06-03 ENCOUNTER — Other Ambulatory Visit: Payer: Self-pay

## 2022-06-03 DIAGNOSIS — E669 Obesity, unspecified: Secondary | ICD-10-CM

## 2022-06-03 MED ORDER — PRAVASTATIN SODIUM 20 MG PO TABS: ORAL_TABLET | ORAL | 3 refills | Status: DC

## 2022-11-09 ENCOUNTER — Other Ambulatory Visit: Payer: Self-pay

## 2022-11-09 DIAGNOSIS — E118 Type 2 diabetes mellitus with unspecified complications: Secondary | ICD-10-CM

## 2022-11-10 MED ORDER — METFORMIN HCL 1000 MG PO TABS
1000.0000 mg | ORAL_TABLET | Freq: Two times a day (BID) | ORAL | 3 refills | Status: DC
Start: 2022-11-10 — End: 2023-11-25

## 2022-11-12 DIAGNOSIS — N1832 Chronic kidney disease, stage 3b: Secondary | ICD-10-CM | POA: Diagnosis not present

## 2022-11-12 DIAGNOSIS — Z6835 Body mass index (BMI) 35.0-35.9, adult: Secondary | ICD-10-CM | POA: Diagnosis not present

## 2022-11-12 DIAGNOSIS — Z8249 Family history of ischemic heart disease and other diseases of the circulatory system: Secondary | ICD-10-CM | POA: Diagnosis not present

## 2022-11-12 DIAGNOSIS — Z833 Family history of diabetes mellitus: Secondary | ICD-10-CM | POA: Diagnosis not present

## 2022-11-12 DIAGNOSIS — E1122 Type 2 diabetes mellitus with diabetic chronic kidney disease: Secondary | ICD-10-CM | POA: Diagnosis not present

## 2022-11-12 DIAGNOSIS — Z008 Encounter for other general examination: Secondary | ICD-10-CM | POA: Diagnosis not present

## 2022-11-12 DIAGNOSIS — E785 Hyperlipidemia, unspecified: Secondary | ICD-10-CM | POA: Diagnosis not present

## 2022-11-12 DIAGNOSIS — M1990 Unspecified osteoarthritis, unspecified site: Secondary | ICD-10-CM | POA: Diagnosis not present

## 2022-11-12 DIAGNOSIS — M109 Gout, unspecified: Secondary | ICD-10-CM | POA: Diagnosis not present

## 2022-11-12 DIAGNOSIS — I129 Hypertensive chronic kidney disease with stage 1 through stage 4 chronic kidney disease, or unspecified chronic kidney disease: Secondary | ICD-10-CM | POA: Diagnosis not present

## 2022-11-12 DIAGNOSIS — Z7984 Long term (current) use of oral hypoglycemic drugs: Secondary | ICD-10-CM | POA: Diagnosis not present

## 2023-01-07 ENCOUNTER — Ambulatory Visit (INDEPENDENT_AMBULATORY_CARE_PROVIDER_SITE_OTHER): Payer: Medicare HMO | Admitting: Student

## 2023-01-07 ENCOUNTER — Other Ambulatory Visit: Payer: Self-pay | Admitting: Student

## 2023-01-07 VITALS — BP 155/72 | HR 79 | Wt 183.8 lb

## 2023-01-07 DIAGNOSIS — I10 Essential (primary) hypertension: Secondary | ICD-10-CM

## 2023-01-07 DIAGNOSIS — E119 Type 2 diabetes mellitus without complications: Secondary | ICD-10-CM | POA: Diagnosis not present

## 2023-01-07 DIAGNOSIS — M1A9XX0 Chronic gout, unspecified, without tophus (tophi): Secondary | ICD-10-CM | POA: Diagnosis not present

## 2023-01-07 LAB — POCT GLYCOSYLATED HEMOGLOBIN (HGB A1C): HbA1c, POC (controlled diabetic range): 8 % — AB (ref 0.0–7.0)

## 2023-01-07 NOTE — Patient Instructions (Addendum)
It was great to see you today! Thank you for choosing [facility] for your primary care.  Today we addressed: Thank you for reporting all of your vaccinations. Your A1c for diabetes is in a good range.  Continue your metformin as is. Your blood pressure is slightly elevated, I would recommend continuing your losartan.  We have opted not to add another medication at this time.  Please follow-up with me in 3 months for this. I am glad your gout is improved.  Hoy abordamos: 1. Gracias por informar todas sus vacunas. 2. Su A1c para la diabetes est en un buen rango.  Contine su metformina como est. 3. Su presin arterial est ligeramente elevada, le recomendara continuar con el losartn.  Hemos optado por no agregar otro medicamento en este momento.  Por favor haga un seguimiento conmigo en 3 meses para esto.  If you haven't already, sign up for My Chart to have easy access to your labs results, and communication with your primary care physician.  Return in about 3 months (around 04/07/2023) for Diabetes follow-up. Please arrive 15 minutes before your appointment to ensure smooth check in process.  We appreciate your efforts in making this happen.  Thank you for allowing me to participate in your care, [name], DO 01/07/2023, 2:43 PM PGY-3, [facility]

## 2023-01-07 NOTE — Progress Notes (Signed)
  SUBJECTIVE:   CHIEF COMPLAINT / HPI:   Hypertension: Home medications include: Losartan 100 mg daily. She endorses taking these medications as prescribed. Patient has had a BMP in the past 1 year.  Type 2 Diabetes: Home medications include: Metformin 1000 mg twice daily. Does endorse compliance. Patient is not up to date on diabetic eye per chart review. She reports receiving diabetic eye exam and that her eyes were fine but does not have paperwork with her.   Healthcare maintenance: Report she got shingles vaccine, pneumonia vaccine, and flu vaccine at the pharmacy.   Spanish phone interpreter used throughout encounter.   PERTINENT  PMH / PSH: T2DM, HLD, HTN, gout   OBJECTIVE:  BP (!) 155/72   Pulse 79   Wt 183 lb 12.8 oz (83.4 kg)   SpO2 99%   BMI 35.90 kg/m  Gen: well-appearing, NAD CV: RRR, no murmurs auscultated Pulm: normal WOB  ASSESSMENT/PLAN:   Assessment & Plan Type 2 diabetes mellitus without complication, without long-term current use of insulin (HCC) Well controlled , A1c 8.0. Goal 7-8. Continue Metformin 1000mg  BID. Obtain microalbumin/creatinine ratio today. Essential hypertension BP: (!) 155/72 today. Poorly controlled. Goal is to avoid hypotension. Continue to work on healthy dietary habits and exercise. Medication regimen: losartan 100mg  daily. We have opted to not adjust regimen today despite SBP elevation.  Chronic gout without tophus, unspecified cause, unspecified site Well-controlled on allopurinol 200mg  daily. She has needed colchicine less each month, only needed it once last month. No change to plan. Return in about 3 months (around 04/07/2023) for Diabetes follow-up. [name], DO 01/08/2023, 3:11 PM PGY-3, [facility]

## 2023-01-08 ENCOUNTER — Encounter: Payer: Self-pay | Admitting: Student

## 2023-01-08 LAB — MICROALBUMIN / CREATININE URINE RATIO
Creatinine, Urine: 94.6 mg/dL
Microalb/Creat Ratio: 82 mg/g{creat} — ABNORMAL HIGH (ref 0–29)
Microalbumin, Urine: 78 ug/mL

## 2023-01-08 NOTE — Assessment & Plan Note (Addendum)
BP: (!) 155/72 today. Poorly controlled. Goal is to avoid hypotension. Continue to work on healthy dietary habits and exercise. Medication regimen: losartan 100mg  daily. We have opted to not adjust regimen today despite SBP elevation.

## 2023-01-08 NOTE — Assessment & Plan Note (Addendum)
Well-controlled on allopurinol 200mg  daily. She has needed colchicine less each month, only needed it once last month. No change to plan.

## 2023-01-08 NOTE — Assessment & Plan Note (Addendum)
Well controlled, A1c 8.0. Goal 7-8. Continue Metformin 1000mg  BID. Obtain microalbumin/creatinine ratio today.

## 2023-02-23 ENCOUNTER — Other Ambulatory Visit: Payer: Self-pay

## 2023-02-23 DIAGNOSIS — M1A9XX0 Chronic gout, unspecified, without tophus (tophi): Secondary | ICD-10-CM

## 2023-02-23 MED ORDER — COLCHICINE 0.6 MG PO TABS: ORAL_TABLET | ORAL | 3 refills | Status: DC

## 2023-03-15 NOTE — Progress Notes (Unsigned)
  SUBJECTIVE:   CHIEF COMPLAINT / HPI:   Type 2 Diabetes: Home medications include: Metformin 1000 mg twice daily. Does endorse compliance. Patient is not up to date on diabetic eye.  Gout: she had an episode last week.  She has had 4 episodes in the last month which is more than usual.  She is adherent with allopurinol and takes colchicine when she has these flares.  She commonly gets them in her right hand and right foot.  Most recently was her little toe of her right foot.  Spanish interpreter utilized throughout encounter.  PERTINENT  PMH / PSH: T2DM, HLD, HTN, gout   OBJECTIVE:  BP 131/80   Pulse 87   Wt 187 lb (84.8 kg)   SpO2 99%   BMI 36.52 kg/m  General: Well-appearing, NAD CV: RRR, murmurs auscultated  ASSESSMENT/PLAN:   Assessment & Plan Type 2 diabetes mellitus without complication, without long-term current use of insulin (HCC) Poorly controlled, A1c 8.4.  Goal 7-8.  Continue metformin 1000 mg twice daily.  We have agreed to start Jardiance.  Message sent to clinical pharmacy tech to assist with payment given we have had trouble with this in the past.  Coincidentally this will also help with protecting kidney function. Chronic gout without tophus, unspecified cause, unspecified site Allopurinol dosage to high based on her kidney function.  Transition to Febuxostat 40 mg daily.  Recheck gout level and kidney function in 1 month. Return in about 1 month (around 04/16/2023) for Gout and diabetes follow-up. [name], DO 03/16/2023, 4:23 PM PGY-3, [facility]

## 2023-03-16 ENCOUNTER — Ambulatory Visit (INDEPENDENT_AMBULATORY_CARE_PROVIDER_SITE_OTHER): Payer: Self-pay | Admitting: Student

## 2023-03-16 VITALS — BP 131/80 | HR 87 | Wt 187.0 lb

## 2023-03-16 DIAGNOSIS — E119 Type 2 diabetes mellitus without complications: Secondary | ICD-10-CM

## 2023-03-16 DIAGNOSIS — M1A9XX0 Chronic gout, unspecified, without tophus (tophi): Secondary | ICD-10-CM | POA: Diagnosis not present

## 2023-03-16 DIAGNOSIS — Z7984 Long term (current) use of oral hypoglycemic drugs: Secondary | ICD-10-CM

## 2023-03-16 LAB — POCT GLYCOSYLATED HEMOGLOBIN (HGB A1C): HbA1c, POC (controlled diabetic range): 8.4 % — AB (ref 0.0–7.0)

## 2023-03-16 MED ORDER — EMPAGLIFLOZIN 10 MG PO TABS: 10.0000 mg | ORAL_TABLET | Freq: Every day | ORAL | 3 refills | Status: AC

## 2023-03-16 MED ORDER — FEBUXOSTAT 40 MG PO TABS: 40.0000 mg | ORAL_TABLET | Freq: Every day | ORAL | 0 refills | Status: DC

## 2023-03-16 NOTE — Assessment & Plan Note (Signed)
 Poorly controlled, A1c 8.4.  Goal 7-8.  Continue metformin 1000 mg twice daily.  We have agreed to start Jardiance.  Message sent to clinical pharmacy tech to assist with payment given we have had trouble with this in the past.  Coincidentally this will also help with protecting kidney function.

## 2023-03-16 NOTE — Assessment & Plan Note (Signed)
 Allopurinol dosage to high based on her kidney function.  Transition to Febuxostat 40 mg daily.  Recheck gout level and kidney function in 1 month.

## 2023-03-16 NOTE — Patient Instructions (Addendum)
 It was great to see you today! Thank you for choosing [facility] for your primary care.  Hoy abordamos los siguientes temas: 1. Gota: debido a la funcin renal, necesitamos cambiar los medicamentos. Me gustara volver a controlar su nivel de cido rico para Landscape architect gota en un mes y ver cmo ha sido este cambio para usted. Suspenda el alopurinol. Le he enviado un nuevo medicamento llamado Febuxostat. 2. Diabetes: su diabetes ha empeorado y su A1c es de 8,4. Necesitamos iniciar un nuevo medicamento que, en consecuencia, tambin le ayudar con la gota. Se llama Jardiance. Har que mi equipo de [employer] la carta para solicitar asistencia financiera.  Today we addressed: Gout: Because of your kidney function we need to change medications.  I would like to recheck your uric acid for your gout in 1 month and see how this change has been for you.  Please discontinue allopurinol.  I have sent in a new medication called Febuxostat. Diabetes: Your diabetes has worsened and your A1c is 8.4.  We need to initiate a new medication which consequently will also help with your gout.  It is called Jardiance.  I will have my pharmacy team send you the letter for financial assistance.  If you haven't already, sign up for My Chart to have easy access to your labs results, and communication with your primary care physician.  Return in about 1 month (around 04/16/2023) for Gout and diabetes follow-up. Please arrive 15 minutes before your appointment to ensure smooth check in process.  We appreciate your efforts in making this happen.  Thank you for allowing me to participate in your care, Laura Mattocks, DO 03/16/2023, 2:06 PM PGY-3, [facility]

## 2023-03-18 ENCOUNTER — Other Ambulatory Visit (HOSPITAL_COMMUNITY): Payer: Self-pay

## 2023-03-22 ENCOUNTER — Telehealth: Payer: Self-pay

## 2023-03-22 NOTE — Telephone Encounter (Signed)
-----   Message from [name] sent at 03/18/2023 11:28 AM EST ----- Regarding: RE: Jardiance Thank you! ----- Message ----- From: Otho Najjar, CPhT Sent: 03/18/2023   9:14 AM EST To: [name], DO Subject: RE: Laura Aguirre Redo morning,   A 30 day supply of medication looks to be about $150 on patients insurance. I can mail her an application for assistance with [employer] :) ----- Message ----- From: [name], DO Sent: 03/16/2023   4:24 PM EST To: Otho Najjar, CPhT Subject: Laura Aguirre,  We have agreed to start Jardiance on this patient.  She has had trouble having this financially covered in the past.  Would you be able to assist her with the documentation necessary for financial support?  Dr. Royal Piedra

## 2023-03-23 NOTE — Progress Notes (Signed)
 Pharmacy Medication Assistance Program Note    03/23/2023  Patient ID: Laura Aguirre, female   DOB: [birth year], 78 y.o.   MRN: [id number]     03/22/2023  Outreach Medication One  Manufacturer Medication One [employer]  [employer] Drugs Jardiance  Type of Assistance Manufacturer Assistance  Date Application Sent to Patient 03/23/2023  Application Items Requested Application;Proof of Income    Mailed to pt home.

## 2023-04-19 ENCOUNTER — Ambulatory Visit (INDEPENDENT_AMBULATORY_CARE_PROVIDER_SITE_OTHER): Payer: Self-pay | Admitting: Student

## 2023-04-19 VITALS — BP 125/80 | HR 84 | Wt 183.6 lb

## 2023-04-19 DIAGNOSIS — N1832 Chronic kidney disease, stage 3b: Secondary | ICD-10-CM | POA: Diagnosis not present

## 2023-04-19 DIAGNOSIS — M1A9XX0 Chronic gout, unspecified, without tophus (tophi): Secondary | ICD-10-CM | POA: Diagnosis not present

## 2023-04-19 MED ORDER — FEBUXOSTAT 40 MG PO TABS
40.0000 mg | ORAL_TABLET | Freq: Every day | ORAL | 3 refills | Status: DC
Start: 2023-04-19 — End: 2023-07-22

## 2023-04-19 NOTE — Patient Instructions (Addendum)
 It was great to see you today! Thank you for choosing [facility] for your primary care.  Hoy abordamos: 1. Contine con febuxostat. Evite el ibuprofeno y el naproxeno si puede, ya que pueden afectar significativamente sus riones. Use colchicina solo para sus brotes de gota. Hoy revisaremos sus niveles renales y de [place].  Today we addressed: Continue with febuxostat.  Please avoid ibuprofen and naproxen if you can as they can affect your kidneys significantly.  Only use colchicine for your gout flares.  We will recheck your kidney levels and your gout levels today.  If you haven't already, sign up for My Chart to have easy access to your labs results, and communication with your primary care physician. We are checking some labs today. If they are abnormal, I will call you. If they are normal, I will send you a MyChart message (if it is active) or a letter in the mail. If you do not hear about your labs in the next 2 weeks, please call the office. Return if symptoms worsen or fail to improve. Please arrive 15 minutes before your appointment to ensure smooth check in process.  We appreciate your efforts in making this happen.  Thank you for allowing me to participate in your care, [name], DO 04/19/2023, 1:39 PM PGY-3, [facility]

## 2023-04-19 NOTE — Assessment & Plan Note (Signed)
 Single mild attack in the last month, tolerating febuxostat well.  Appears to be controlling her gout better than allopurinol prior. -Recheck uric acid level -Refill febuxostat 40 mg daily, she also has colchicine as needed

## 2023-04-19 NOTE — Assessment & Plan Note (Signed)
 Recheck BMP, counseled on limiting use of NSAIDs.

## 2023-04-19 NOTE — Progress Notes (Signed)
  SUBJECTIVE:   CHIEF COMPLAINT / HPI:   Presents today for gout and kidney disease follow-up. She reports a single, mild gout attack in the last month. She attributes the attack to dietary indiscretion, specifically the consumption of cheese pupusas. She is currently taking Febuxostat, which she prefers over the allopurinol she has taken in the past. She also reports taking an over-the-counter NSAID, believed to be naproxen, for pain management during gout attacks when colchicine is unavailable.  Spanish interpreter used throughout encounter.  PERTINENT  PMH / PSH: T2DM, HLD, HTN, gout   OBJECTIVE:  BP 125/80   Pulse 84   Wt 183 lb 9.6 oz (83.3 kg)   SpO2 98%   BMI 35.86 kg/m  General: Well-appearing, NAD  ASSESSMENT/PLAN:   Assessment & Plan Chronic gout without tophus, unspecified cause, unspecified site Single mild attack in the last month, tolerating febuxostat well.  Appears to be controlling her gout better than allopurinol prior. -Recheck uric acid level -Refill febuxostat 40 mg daily, she also has colchicine as needed Stage 3b chronic kidney disease (HCC) Recheck BMP, counseled on limiting use of NSAIDs. Return if symptoms worsen or fail to improve.  [name], DO 04/19/2023, 1:41 PM PGY-3, [facility]

## 2023-04-20 LAB — URIC ACID: Uric Acid: 5.6 mg/dL (ref 3.1–7.9)

## 2023-04-20 LAB — BASIC METABOLIC PANEL WITH GFR
BUN/Creatinine Ratio: 28 (ref 12–28)
BUN: 32 mg/dL — ABNORMAL HIGH (ref 8–27)
CO2: 19 mmol/L — ABNORMAL LOW (ref 20–29)
Calcium: 9.5 mg/dL (ref 8.7–10.3)
Chloride: 104 mmol/L (ref 96–106)
Creatinine, Ser: 1.15 mg/dL — ABNORMAL HIGH (ref 0.57–1.00)
Glucose: 120 mg/dL — ABNORMAL HIGH (ref 70–99)
Potassium: 5.7 mmol/L — ABNORMAL HIGH (ref 3.5–5.2)
Sodium: 139 mmol/L (ref 134–144)
eGFR: 49 mL/min/{1.73_m2} — ABNORMAL LOW (ref >59)

## 2023-04-23 ENCOUNTER — Telehealth: Payer: Self-pay | Admitting: Student

## 2023-04-23 DIAGNOSIS — E875 Hyperkalemia: Secondary | ICD-10-CM

## 2023-04-23 NOTE — Telephone Encounter (Signed)
 Called to speak with the patient at regarding hyperkalemia.  She opted to have her husband speak for her over the phone.  Discussed elevation of potassium level, recommend lab recheck on Monday, appointment made and future lab ordered.  Recommended patient fast for this and avoid potassium rich foods such as bananas, sweet potatoes, plantains, kiwis as she has been eating some of these.  Also recommended she stay hydrated and avoid NSAID medications.  They voiced understanding of this and were thankful for phone call.

## 2023-04-26 ENCOUNTER — Other Ambulatory Visit: Payer: Self-pay

## 2023-04-26 DIAGNOSIS — E875 Hyperkalemia: Secondary | ICD-10-CM

## 2023-04-27 LAB — BASIC METABOLIC PANEL WITH GFR
BUN/Creatinine Ratio: 26 (ref 12–28)
BUN: 28 mg/dL — ABNORMAL HIGH (ref 8–27)
CO2: 20 mmol/L (ref 20–29)
Calcium: 9.2 mg/dL (ref 8.7–10.3)
Chloride: 107 mmol/L — ABNORMAL HIGH (ref 96–106)
Creatinine, Ser: 1.08 mg/dL — ABNORMAL HIGH (ref 0.57–1.00)
Glucose: 140 mg/dL — ABNORMAL HIGH (ref 70–99)
Potassium: 5.6 mmol/L — ABNORMAL HIGH (ref 3.5–5.2)
Sodium: 139 mmol/L (ref 134–144)
eGFR: 53 mL/min/{1.73_m2} — ABNORMAL LOW (ref >59)

## 2023-05-03 ENCOUNTER — Telehealth: Payer: Self-pay | Admitting: Student

## 2023-05-03 DIAGNOSIS — I10 Essential (primary) hypertension: Secondary | ICD-10-CM

## 2023-05-03 DIAGNOSIS — E875 Hyperkalemia: Secondary | ICD-10-CM

## 2023-05-03 MED ORDER — LOKELMA 10 G PO PACK: 10.0000 g | PACK | Freq: Every day | ORAL | 0 refills | Status: DC

## 2023-05-03 MED ORDER — LOSARTAN POTASSIUM 100 MG PO TABS: 50.0000 mg | ORAL_TABLET | Freq: Every day | ORAL | Status: DC

## 2023-05-03 NOTE — Telephone Encounter (Signed)
 Attempted to call patient regarding hyperkalemia using assistance of Spanish interpreter. Home phone unable to leave VM. Called 2nd number and it went to [employer]. Attempted to call her husband's phone number as well but number was out of service.  Hyperkalemia persists at 5.6. Recommend decreasing losartan  to 50mg  daily and I have sent Lokelma  10g pack to pharmacy for 3 total doses once daily. Recheck bloodwork on Friday.   I will attempt to call again at a later time.

## 2023-05-13 ENCOUNTER — Telehealth: Payer: Self-pay | Admitting: Student

## 2023-05-13 ENCOUNTER — Encounter: Payer: Self-pay | Admitting: Student

## 2023-05-13 NOTE — Telephone Encounter (Signed)
 Attempted to call again x 2 with interpreter services regarding hyperkalemia.  Voicemail box is full and unfortunately this is the only phone number that is active that I have for patient.  Should she call back, recommend she take half dose of losartan  and pick up Lokelma  at pharmacy.  She will need repeat lab check as well.  I will send letter and attempt again in the future.

## 2023-05-19 ENCOUNTER — Other Ambulatory Visit: Payer: Self-pay | Admitting: Student

## 2023-05-19 DIAGNOSIS — M1A9XX0 Chronic gout, unspecified, without tophus (tophi): Secondary | ICD-10-CM

## 2023-05-21 ENCOUNTER — Telehealth: Payer: Self-pay | Admitting: Student

## 2023-05-21 NOTE — Telephone Encounter (Signed)
 Attempted to call patient again regarding hyperkalemia with use of Spanish interpreter.  Again no response by call and unable to leave voice message.  I do not have other ways of contacting patient at this point.  Letter has already been sent.

## 2023-05-28 ENCOUNTER — Telehealth: Payer: Self-pay

## 2023-06-01 ENCOUNTER — Encounter: Payer: Self-pay | Admitting: Student

## 2023-06-01 ENCOUNTER — Ambulatory Visit (INDEPENDENT_AMBULATORY_CARE_PROVIDER_SITE_OTHER): Admitting: Student

## 2023-06-01 VITALS — BP 129/78 | HR 75 | Ht 60.0 in | Wt 183.4 lb

## 2023-06-01 DIAGNOSIS — E875 Hyperkalemia: Secondary | ICD-10-CM | POA: Diagnosis not present

## 2023-06-01 DIAGNOSIS — I10 Essential (primary) hypertension: Secondary | ICD-10-CM | POA: Diagnosis not present

## 2023-06-01 NOTE — Assessment & Plan Note (Signed)
 BP: 129/78 today. Well controlled. Goal of <140. Medication regimen: Discontinue losartan  as above, no antihypertensives at this time.  Should she need these in the future consider amlodipine .  Given her history of gout, HCTZ would not be beneficial.

## 2023-06-01 NOTE — Patient Instructions (Addendum)
 It was great to see you today! Thank you for choosing [facility] for your primary care.  Today we addressed: Recheck potassium on Friday.  I am going to discontinue your losartan .  If you haven't already, sign up for My Chart to have easy access to your labs results, and communication with your primary care physician.  Please arrive 15 minutes before your appointment to ensure smooth check in process.  We appreciate your efforts in making this happen.  Thank you for allowing me to participate in your care, Veronia Goon, DO 06/01/2023, 1:48 PM PGY-3, [facility]

## 2023-06-01 NOTE — Progress Notes (Signed)
  SUBJECTIVE:   CHIEF COMPLAINT / HPI:   Hyperkalemia: Blood work on 4/7 showed hyperkalemia of 5.7, repeat 1 week after with hyperkalemia 5.6.  Unable to get in touch with patient for approximately 1 month. She was unable to pickup Lokelma  because it was too expensive but she has been able to decrease her losartan  to 50 mg daily and she has felt better on this medication.  PERTINENT  PMH / PSH: T2DM, HLD, HTN, gout   OBJECTIVE:  BP 129/78   Pulse 75   SpO2 98%  Gen: well-appearing, NAD CV: RRR, no murmurs appreciable Pulm: normal WOB  ASSESSMENT/PLAN:   Assessment & Plan Hyperkalemia Given only change of losartan  dosing for 3 days, recheck BMP on Friday.  Advise discontinuation of losartan  today given her age and difficult to get in touch with.  Discussed other dietary causes of hyperkalemia as well.  Unfortunately will not be able to use Lokelma  given pricing.  Lab appointment made for patient during encounter. Essential hypertension BP: 129/78 today. Well controlled. Goal of <140. Medication regimen: Discontinue losartan  as above, no antihypertensives at this time.  Should she need these in the future consider amlodipine .  Given her history of gout, HCTZ would not be beneficial.  Veronia Goon, DO 06/01/2023, 1:35 PM PGY-3, [facility]

## 2023-06-04 ENCOUNTER — Other Ambulatory Visit

## 2023-06-04 DIAGNOSIS — E875 Hyperkalemia: Secondary | ICD-10-CM

## 2023-06-05 LAB — BASIC METABOLIC PANEL WITH GFR
BUN/Creatinine Ratio: 27 (ref 12–28)
BUN: 33 mg/dL — ABNORMAL HIGH (ref 8–27)
CO2: 17 mmol/L — ABNORMAL LOW (ref 20–29)
Calcium: 9.5 mg/dL (ref 8.7–10.3)
Chloride: 103 mmol/L (ref 96–106)
Creatinine, Ser: 1.24 mg/dL — ABNORMAL HIGH (ref 0.57–1.00)
Glucose: 156 mg/dL — ABNORMAL HIGH (ref 70–99)
Potassium: 5.5 mmol/L — ABNORMAL HIGH (ref 3.5–5.2)
Sodium: 138 mmol/L (ref 134–144)
eGFR: 45 mL/min/{1.73_m2} — ABNORMAL LOW (ref >59)

## 2023-06-11 ENCOUNTER — Other Ambulatory Visit: Payer: Self-pay

## 2023-06-11 DIAGNOSIS — E66812 Obesity, class 2: Secondary | ICD-10-CM

## 2023-06-11 MED ORDER — PRAVASTATIN SODIUM 20 MG PO TABS
ORAL_TABLET | ORAL | 3 refills | Status: AC
Start: 2023-06-11 — End: ?

## 2023-06-14 ENCOUNTER — Ambulatory Visit: Payer: Self-pay | Admitting: Student

## 2023-06-14 ENCOUNTER — Ambulatory Visit (INDEPENDENT_AMBULATORY_CARE_PROVIDER_SITE_OTHER): Admitting: Student

## 2023-06-14 VITALS — BP 120/80 | HR 66 | Ht 60.0 in | Wt 183.4 lb

## 2023-06-14 DIAGNOSIS — I10 Essential (primary) hypertension: Secondary | ICD-10-CM | POA: Diagnosis not present

## 2023-06-14 DIAGNOSIS — N1832 Chronic kidney disease, stage 3b: Secondary | ICD-10-CM

## 2023-06-14 DIAGNOSIS — E875 Hyperkalemia: Secondary | ICD-10-CM

## 2023-06-14 NOTE — Progress Notes (Signed)
  SUBJECTIVE:   CHIEF COMPLAINT / HPI:   Presents today regarding lab work.  She has persistent hyperkalemia despite discontinuing all of her blood pressure medications.  She denies having taken any ibuprofen  or Aleve.  PERTINENT  PMH / PSH: T2DM, HLD, HTN, gout, CKD3a  OBJECTIVE:  BP 120/80   Pulse 66   Ht 5' (1.524 m)   Wt 183 lb 6.4 oz (83.2 kg)   SpO2 97%   BMI 35.82 kg/m  General: Well-appearing, NAD  ASSESSMENT/PLAN:   Assessment & Plan Hyperkalemia Persistent hyperkalemia, most recently 5.5.  I do not have any explanations as to why this has persisted.  Recheck CMP now that she has been off of losartan  for greater than 1 week.  Should this persist, may be related to her chronic kidney disease and I will recommend follow-up with nephrology. Essential hypertension Well-controlled without medication.  Continue to monitor.  Veronia Goon, DO 06/14/2023, 10:53 AM PGY-3, [facility]

## 2023-06-14 NOTE — Assessment & Plan Note (Signed)
Well controlled without medication Continue to monitor 

## 2023-06-15 LAB — COMPREHENSIVE METABOLIC PANEL WITH GFR
ALT: 18 IU/L (ref 0–32)
AST: 24 IU/L (ref 0–40)
Albumin: 4.4 g/dL (ref 3.8–4.8)
Alkaline Phosphatase: 98 IU/L (ref 44–121)
BUN/Creatinine Ratio: 21 (ref 12–28)
BUN: 27 mg/dL (ref 8–27)
Bilirubin Total: 0.6 mg/dL (ref 0.0–1.2)
CO2: 20 mmol/L (ref 20–29)
Calcium: 9.3 mg/dL (ref 8.7–10.3)
Chloride: 105 mmol/L (ref 96–106)
Creatinine, Ser: 1.29 mg/dL — ABNORMAL HIGH (ref 0.57–1.00)
Globulin, Total: 2.7 g/dL (ref 1.5–4.5)
Glucose: 137 mg/dL — ABNORMAL HIGH (ref 70–99)
Potassium: 5.4 mmol/L — ABNORMAL HIGH (ref 3.5–5.2)
Sodium: 140 mmol/L (ref 134–144)
Total Protein: 7.1 g/dL (ref 6.0–8.5)
eGFR: 43 mL/min/{1.73_m2} — ABNORMAL LOW (ref >59)

## 2023-06-17 ENCOUNTER — Encounter: Payer: Self-pay | Admitting: Student

## 2023-06-17 ENCOUNTER — Ambulatory Visit: Payer: Self-pay | Admitting: Student

## 2023-06-17 DIAGNOSIS — E875 Hyperkalemia: Secondary | ICD-10-CM

## 2023-06-17 NOTE — Telephone Encounter (Signed)
 Attempted to call patient's home phone number and husband's phone number unsuccessfully.  Unable to leave voice message.  Will send letter with advice stating potassium continues to be elevated.  I will place nephrology referral as discussed during our last appointment.

## 2023-06-22 ENCOUNTER — Encounter: Payer: Self-pay | Admitting: *Deleted

## 2023-07-02 NOTE — Telephone Encounter (Signed)
 Issue taken care of by Dr. Janae Mclean.  Laura Aguirre, CMA

## 2023-07-22 ENCOUNTER — Other Ambulatory Visit: Payer: Self-pay

## 2023-07-22 DIAGNOSIS — M1A9XX0 Chronic gout, unspecified, without tophus (tophi): Secondary | ICD-10-CM

## 2023-07-22 MED ORDER — FEBUXOSTAT 40 MG PO TABS
40.0000 mg | ORAL_TABLET | Freq: Every day | ORAL | 1 refills | Status: DC
Start: 2023-07-22 — End: 2023-09-27

## 2023-09-17 ENCOUNTER — Ambulatory Visit (INDEPENDENT_AMBULATORY_CARE_PROVIDER_SITE_OTHER)

## 2023-09-17 VITALS — BP 156/76 | HR 87 | Ht 60.0 in | Wt 182.4 lb

## 2023-09-17 DIAGNOSIS — E119 Type 2 diabetes mellitus without complications: Secondary | ICD-10-CM | POA: Diagnosis not present

## 2023-09-17 DIAGNOSIS — I10 Essential (primary) hypertension: Secondary | ICD-10-CM | POA: Diagnosis not present

## 2023-09-17 DIAGNOSIS — E875 Hyperkalemia: Secondary | ICD-10-CM

## 2023-09-17 LAB — POCT GLYCOSYLATED HEMOGLOBIN (HGB A1C): HbA1c, POC (controlled diabetic range): 7.6 % — AB (ref 0.0–7.0)

## 2023-09-17 NOTE — Assessment & Plan Note (Addendum)
 Initial BP 156/76.  Recheck 123/73. Last visit on no BP meds: 120/80 No home BP. Holding home losartan  100 mg until 10/15/23 for labs. Not starting amlodipine  or alternative BP med in the interim as her BP seems well controlled. She will check her BP at a pharmacy 1-2 times weekly so we have outpatient values.  Diet changes discussed extensively. Handout provided. Continue exercise Recheck BP in 1 week.

## 2023-09-17 NOTE — Progress Notes (Addendum)
    SUBJECTIVE:   CHIEF COMPLAINT / HPI:   Hyperkalemia No increase in fatigue  No muscle weakness She did not go to see nephrology Has been taking Losartan  since last visit. Sounds like she had hyperK while on losartan . Discontinued for a week prior to June CMP. K was still elevated at 5.4. She was not instructed to restart Losartan  from what I can tell but went back on it.   HTN Does not check her BP at home Has been taking losartan  100 mg daily.  Walks daily for an hour.  Diabetes Taking metformin  twice daily. Not taking jardiance .   No symptomatic lows  PERTINENT  PMH / PSH: DM, HTN, CKD 3  OBJECTIVE:   BP (!) 156/76   Pulse 87   Ht 5' (1.524 m)   Wt 182 lb 6.4 oz (82.7 kg)   SpO2 99%   BMI 35.62 kg/m    Recheck BP 123/73  CV: RRR, no murmurs, no peripheral edema, no hepatojugular reflux, no JVD Pulm: CTAB  ASSESSMENT/PLAN:   Assessment & Plan Hyperkalemia - BMP today - Holding losartan  100 mg until follow up 10/15/23 - Phone number to contact: [phone] if K elevated. Would send kayexalate 15 g daily as lokelma  previously too expensive if K elevated >5.2 but not higher than 6.5. >6.5 would send to ED - Plan for aldosterone/renin/BMP in October after she has been off losartan  for a month Essential hypertension Initial BP 156/76.  Recheck 123/73. Last visit on no BP meds: 120/80 No home BP. Holding home losartan  100 mg until 10/15/23 for labs. Not starting amlodipine  or alternative BP med in the interim as her BP seems well controlled. She will check her BP at a pharmacy 1-2 times weekly so we have outpatient values.  Diet changes discussed extensively. Handout provided. Continue exercise Recheck BP in 1 week.  Type 2 diabetes mellitus without complication, without long-term current use of insulin (HCC) A1C today 7.6. Continue metformin  1000 mg BID. Diet changes discussed extensively. Handout provided. Continue exercise  Delaila Nand [name],  MD [facility]

## 2023-09-17 NOTE — Assessment & Plan Note (Addendum)
 A1C today 7.6. Continue metformin  1000 mg BID. Diet changes discussed extensively. Handout provided. Continue exercise

## 2023-09-17 NOTE — Patient Instructions (Addendum)
 Elevated Potassium - Stop taking the Losartan  until 10/15/23. We will check your potassium today. I will call you if the potassium is high and we need to treat it. Will do additional labs in one month to see what is causing your potassium elevation.  High blood pressure - please go to a pharmacy to check your blood pressure 1-2 times per week, write them down, and bring it back to your next visit. Diet changes like we talked about  Diabetes - Keep taking metformin  twice a day. Diet changes like we talked about  Potasio elevado - Deje de tomar Losartn hasta el 3 de [place] de 2025. Revisaremos su potasio hoy. Te llamar si el potasio est alto y necesitamos tratarlo. Haremos pruebas adicionales en un mes para ver qu est causando la elevacin de su potasio. Hipertensin - Por favor, dirgete a una farmacia para revisar tu presin arterial de 1 a 2 veces por semana, antalas y trelas a tu prxima cita. Cambios en la dieta como hablamos. Diabetes - Sigue tomando metformina dos veces al da. Cambios en la dieta como hablamos.

## 2023-09-18 LAB — BASIC METABOLIC PANEL WITH GFR
BUN/Creatinine Ratio: 21 (ref 12–28)
BUN: 25 mg/dL (ref 8–27)
CO2: 20 mmol/L (ref 20–29)
Calcium: 9 mg/dL (ref 8.7–10.3)
Chloride: 102 mmol/L (ref 96–106)
Creatinine, Ser: 1.2 mg/dL — ABNORMAL HIGH (ref 0.57–1.00)
Glucose: 146 mg/dL — ABNORMAL HIGH (ref 70–99)
Potassium: 4.4 mmol/L (ref 3.5–5.2)
Sodium: 138 mmol/L (ref 134–144)
eGFR: 47 mL/min/1.73 — ABNORMAL LOW (ref >59)

## 2023-09-20 ENCOUNTER — Ambulatory Visit: Payer: Self-pay

## 2023-09-23 ENCOUNTER — Other Ambulatory Visit (HOSPITAL_COMMUNITY): Payer: Self-pay

## 2023-09-23 ENCOUNTER — Ambulatory Visit (INDEPENDENT_AMBULATORY_CARE_PROVIDER_SITE_OTHER): Payer: Self-pay

## 2023-09-23 ENCOUNTER — Other Ambulatory Visit: Payer: Self-pay

## 2023-09-23 VITALS — BP 154/78 | HR 60 | Wt 183.0 lb

## 2023-09-23 DIAGNOSIS — E118 Type 2 diabetes mellitus with unspecified complications: Secondary | ICD-10-CM

## 2023-09-23 DIAGNOSIS — I10 Essential (primary) hypertension: Secondary | ICD-10-CM | POA: Diagnosis not present

## 2023-09-23 DIAGNOSIS — M1A9XX0 Chronic gout, unspecified, without tophus (tophi): Secondary | ICD-10-CM | POA: Diagnosis not present

## 2023-09-23 DIAGNOSIS — E875 Hyperkalemia: Secondary | ICD-10-CM | POA: Diagnosis not present

## 2023-09-23 DIAGNOSIS — N1831 Chronic kidney disease, stage 3a: Secondary | ICD-10-CM

## 2023-09-23 MED ORDER — COLCHICINE 0.6 MG PO TABS: ORAL_TABLET | ORAL | 3 refills | Status: AC

## 2023-09-23 MED ORDER — BLOOD PRESSURE MONITOR/ARM DEVI
0 refills | Status: AC
  Filled 2023-09-23: qty 1, fill #0

## 2023-09-23 NOTE — Assessment & Plan Note (Signed)
 Restarting losartan .  Jardiance  previously prescribed but too expensive for patient. Reached out to our pharmacy team for assistance with cost.

## 2023-09-23 NOTE — Assessment & Plan Note (Addendum)
 Refilled colchicine . Continues to provide good control for flares Continue febuxustat daily

## 2023-09-23 NOTE — Assessment & Plan Note (Addendum)
 BP 189/75 > 154/78 today. Unable to check home BP. Goal <130/80, not at goal.  Restart Losartan , held last visit due to hyperK.  Check BP daily at home. BP cuff prescribed.  Follow up in 3 months for recheck.

## 2023-09-23 NOTE — Patient Instructions (Addendum)
  Su nivel de potasio estaba normal! Reiniciaremos su [employer] para su presin arterial y sus riones.  Revise su presin arterial diariamente y antela en un registro. Lleve el registro a su cita de seguimiento.  Tome todos sus medicamentos segn lo recetado! Bien hecho! Por favor, llame a su farmacia si no tiene pravastatina (medicamento para el colesterol) en casa.  Hablar con nuestro equipo de farmacia para obtener Jardiance  (el nuevo medicamento para los riones y la diabetes) a un precio asequible.  Volveremos a Facilities manager potasio, funcin renal, presin arterial y niveles de azcar en sangre en una cita de seguimiento dentro de tres meses.  Repeated in English:  Your potassium level was normal! We will restart your Losartan  every day for your blood pressure and your kidneys.  Check your blood pressure every day, and write it down on a log. Bring back your log to your follow up appointment.  Take all your medications as prescribed! Good job with this. Please call your pharmacy if you do not have the pravastatin  (cholesterol medication) at home.   I will talk to our pharmacy team about getting jardiance  (the new medication for your kidneys and diabetes) affordably.  We will recheck your potassium, kidney function, blood pressure, and blood sugar levels at a follow up visit in three months.

## 2023-09-23 NOTE — Progress Notes (Signed)
    SUBJECTIVE:   CHIEF COMPLAINT / HPI:   HTN No HA, blurry vision, chest pain. Not taking losartan  as instructed at last visit. Unable to check BP. Would like cuff at home - she does not have one.   Gout Flares well controlled with colchicine  PRN and daily febuxostat    PERTINENT  PMH / PSH: DM, HTN, gout  OBJECTIVE:   BP (!) 154/78   Pulse 60   Wt 183 lb (83 kg)   SpO2 100%   BMI 35.74 kg/m    Gen: well appearing, obese female CV: regular rate and rhythm, no m/r/g, no carotid bruits, no JVD, no peripheral edema Pulm: Breathing comfortably on room air  ASSESSMENT/PLAN:   Assessment & Plan Essential hypertension BP 189/75 > 154/78 today. Unable to check home BP. Goal <130/80, not at goal.  Restart Losartan , held last visit due to hyperK.  Check BP daily at home. BP cuff prescribed.  Follow up in 3 months for recheck. Hyperkalemia Last lab draw with normal Potassium despite losartan  use. Suspect elevated K on prior lab draws from April to June were due to hemolysis given normal potassium prior and since then.  No additional hyperkalemia workup at this time.  Will restart losartan .  Recheck BMP in 3 months.  Chronic gout without tophus, unspecified cause, unspecified site Refilled colchicine . Continues to provide good control for flares Continue febuxustat daily Type 2 diabetes mellitus with complication, without long-term current use of insulin (HCC) Last A1c well controlled. Will recheck in three months Taking: metformin  1000 mg BID, statin Stage 3a chronic kidney disease (HCC) Restarting losartan .  Jardiance  previously prescribed but too expensive for patient. Reached out to our pharmacy team for assistance with cost.    Follow up in 3 months for recheck BMP and A1c, BP check  Laura Drouillard [name], MD [facility]

## 2023-09-27 ENCOUNTER — Other Ambulatory Visit: Payer: Self-pay

## 2023-09-27 DIAGNOSIS — M1A9XX0 Chronic gout, unspecified, without tophus (tophi): Secondary | ICD-10-CM

## 2023-09-27 MED ORDER — FEBUXOSTAT 40 MG PO TABS: 40.0000 mg | ORAL_TABLET | Freq: Every day | ORAL | 1 refills | Status: DC

## 2023-09-28 ENCOUNTER — Telehealth: Payer: Self-pay

## 2023-09-28 NOTE — Telephone Encounter (Signed)
 ----- Message from Elio Art Smucker sent at 09/24/2023  1:30 PM EDT ----- Regarding: RE: Jardiance  cost Thank you for explaining this to me! Let's go with Jardiance  (10 mg once daily)  Happy Friday! ----- Message ----- From: Rosina Lavern SAILOR, CPhT Sent: 09/24/2023   8:15 AM EDT To: Elio Art Morrison, MD Subject: RE: Jardiance  cost                             They both have $150+ copays.  For the assistance programs they would be no charge to the patient and would be sent to her home.   If she's approved for Low Income Subsidy, the copays on all her meds will be around $4 or $12. ----- Message ----- From: Art Morrison, Elio, MD Sent: 09/23/2023   4:30 PM EDT To: Lavern SAILOR Rosina, CPhT Subject: RE: Jardiance  cost                             Thank you! Whichever will be cheaper for her should be fine. Do you have a suspicion on which one that will be? ----- Message ----- From: Rosina Lavern SAILOR, CPhT Sent: 09/23/2023   4:15 PM EDT To: Elio Art Morrison, MD Subject: RE: Jardiance  cost                             Sure I can call (I dont see patients in person, unless Dr. Koval would like to)! But it depends on which medication you'd like to go with. Unless you want to stick to the jardiance ? ----- Message ----- From: Art Morrison, Elio, MD Sent: 09/23/2023   3:51 PM EDT To: Lavern SAILOR Rosina, CPhT Subject: RE: Jardiance  cost                             Are you able to call her to discuss these things? She has already left my office for today, and I don't have great knowledge of either program to educate her. She does require a spanish interpreter. Alternatively, would you like me to schedule her in the pharmacy clinic for further discussion of financial assistance?   Thanks! Reagan ----- Message ----- From: Rosina Lavern SAILOR, CPhT Sent: 09/23/2023  10:19 AM EDT To: Elio Art Morrison, MD Subject: RE: Jardiance  cost                              Jardiance  [phone] for a month supply Farxiga $151 for a month supply  Patients options are: applying for Low Income Subsidy with Social Security (this can be done online or over the phone at [phone] & will help with the copay of all her medications if approved), or applying for assistance with the manufacturers. They both have assistance programs in which I can mail the applications. Jardiance ; [employer] Cares, Farxiga; [place].  Just let me know! ----- Message ----- From: Art Morrison, Elio, MD Sent: 09/23/2023   9:34 AM EDT To: Lavern SAILOR Rosina, CPhT Subject: Jardiance  cost                                 I tried to prescribe jardiance  for this patient and it looks like  it will be $165 per month which is too costly for her (understandably!!!) Is there an alternative SGLT2i that is better preferred by her insurance or a way to make this more affordable?  Thank you!! [employer]

## 2023-09-28 NOTE — Progress Notes (Signed)
 Pharmacy Medication Assistance Program Note    10/26/2023  Patient ID: Laura Aguirre, female  DOB: 04-20-1945, 78 y.o.  MRN:  [id number]     09/28/2023  Outreach Medication Two  Manufacturer Medication Two [employer]  [employer] Drugs Jardiance   Dose of Jardiance  10MG   Type of Radiographer, therapeutic Assistance  Date Application Sent to Patient 09/29/2023  Application Items Requested Application;Proof of Income  Date Application Sent to Prescriber 10/19/2023  Name of Prescriber ELIO ART [facility]  Date Application Received From Patient 10/19/2023  Application Items Received From Patient Application  Date Application Received From Provider 10/26/2023  Method Application Sent to Manufacturer Fax  Date Application Submitted to Manufacturer 10/26/2023     NEW - SUBMITTED

## 2023-10-15 ENCOUNTER — Ambulatory Visit: Payer: Self-pay

## 2023-11-10 NOTE — Telephone Encounter (Signed)
   Proof of income needed. If unable to provider on paper, patient can call to give info to company.  Will call patient.

## 2023-11-16 NOTE — Telephone Encounter (Signed)
 Ana Ollison-Moran contacted patient.   Patient will bring copy of income to providers office.

## 2023-11-25 ENCOUNTER — Other Ambulatory Visit: Payer: Self-pay

## 2023-11-25 DIAGNOSIS — E118 Type 2 diabetes mellitus with unspecified complications: Secondary | ICD-10-CM

## 2023-11-25 MED ORDER — METFORMIN HCL 1000 MG PO TABS: 1000.0000 mg | ORAL_TABLET | Freq: Two times a day (BID) | ORAL | 3 refills | Status: AC

## 2023-11-25 NOTE — Progress Notes (Signed)
 JENNIFFER VESSELS                                          MRN: [id number]   11/25/2023   The VBCI Quality Team Specialist reviewed this patient medical record for the purposes of chart review for care gap closure. The following were reviewed: chart review for care gap closure-controlling blood pressure.    VBCI Quality Team

## 2023-12-02 ENCOUNTER — Ambulatory Visit (INDEPENDENT_AMBULATORY_CARE_PROVIDER_SITE_OTHER)

## 2023-12-02 VITALS — BP 146/77 | HR 68 | Ht 60.0 in | Wt 181.6 lb

## 2023-12-02 DIAGNOSIS — E119 Type 2 diabetes mellitus without complications: Secondary | ICD-10-CM

## 2023-12-02 DIAGNOSIS — I10 Essential (primary) hypertension: Secondary | ICD-10-CM

## 2023-12-02 LAB — POCT GLYCOSYLATED HEMOGLOBIN (HGB A1C): HbA1c, POC (controlled diabetic range): 7.9 % — AB (ref 0.0–7.0)

## 2023-12-02 MED ORDER — ATORVASTATIN CALCIUM 40 MG PO TABS
40.0000 mg | ORAL_TABLET | Freq: Every day | ORAL | 3 refills | Status: AC
Start: 2023-12-02 — End: ?

## 2023-12-02 NOTE — Progress Notes (Signed)
    SUBJECTIVE:   CHIEF COMPLAINT / HPI:   DM  Med regimen: metformin  1000 mg BID Has been prescribed jardiance  in the past, but has not received it.  Able to take her medications as scheduled  HTN Has not been checking BP at home.  Med regimen: losartan  100 mg every day. Able to take her medications every day No headaches, vision changes.  PERTINENT  PMH / PSH: DM, HTN, CKD 3  OBJECTIVE:   BP (!) 146/77   Pulse 68   Ht 5' (1.524 m)   Wt 181 lb 9.6 oz (82.4 kg)   SpO2 100%   BMI 35.47 kg/m    General: well appearing female in NAD Diabetic foot exam: Skin is intact and warm. No calluses or ulcers. No deformity is present. Strength testing 5/5 with plantarflexion, dorsiflexion, inversion, and eversion. Sensation intact with monofilament testing sub-great toe, fifth toe, 1st, 3rd, and 5th MTPJ and heel. 2+ DP pulses.    ASSESSMENT/PLAN:   Assessment & Plan Type 2 diabetes mellitus without complication, without long-term current use of insulin (HCC) A1C 7.9. Urine microalbumin/creatinine ratio ordered today.  She was unable to get the Jardiance . Will bring proof of income to clinic so that we can try to get cost assistance for this medication. Continue metformin  1000 mg BID Will change statin to high intensity: atorvastatin 40 mg every day. Prescription provided today. Lipid panel ordered today Essential hypertension BP 146/77 > 146/61 today. Given her diastolic pressure, I will not increase her BP medication.  Continue losartan  100 mg every day  BMP ordered to recheck K with h/o hyperkalemia, suspected due to blood draw hemolysis.   She will follow up next month for an annual well check, per patient request.   Follow up in February with Dr. Cleotilde for A1c check, DM/HTN management.   Laura Aguirre Laura Morrison, MD [facility]

## 2023-12-02 NOTE — Patient Instructions (Addendum)
 Fue un gusto verte hoy.  Por favor, trae TODOS tus medicamentos a cada cita.  Hoy hablamos sobre:  Para tu diabetes y tus riones, estamos intentando ayudarte a conseguir un medicamento llamado Jardiance . Por favor, trae un comprobante de ingresos y djalo en la clnica para que podamos enviarlo a la compaa que fabrica Jardiance  y [employer] para ti a un buen precio.  Quiero cambiar tu medicamento para el colesterol de pravastatina a atorvastatina. Puedes tomar [facility]. Este es un medicamento ms fuerte y se recomienda porque tienes diabetes. Puedes terminar de tomar toda la pravastatina que tengas antes de empezar a tomar la atorvastatina.  Para tu presin arterial, contina tomando losartn 100 mg carmax.  Laura Aguirre por elegir [facility].  Por favor, llama al [phone] si tienes alguna pregunta sobre la cita de [place].  Por favor, llega al menos 15 minutos antes de tu cita programada.  Si hoy le realizaron [employer] de [place], transport planner un mensaje a travs de MyChart o una carta si los [name] son normales. De lo contrario, me comunicar con usted.  Si necesita resurtir sus medicamentos antes de su prxima cita, por favor llame primero a su farmacia.  Dra. Anasofia Micallef [employer] [facility]   It was wonderful to see you today.  Please bring ALL of your medications with you to every visit.   Today we talked about:  For your diabetes and kidneys, we are trying to help you got a medication called Jardiance .  Please bring proof of income and drop it off with the clinic so that we can send it to the company that makes Jardiance  and get this medication for you at a good price.  I do want to change your cholesterol medication from pravastatin  to atorvastatin.  You can take atorvastatin every day.  This is a stronger medication and is recommended because you have diabetes.  For your blood pressure, keep taking the  losartan  100 mg every day.  Thank you for choosing [facility].   Please call [phone] with any questions about today's appointment.  Please arrive at least 15 minutes prior to your scheduled appointments.   If you had blood work today, I will send you a MyChart message or a letter if results are normal. Otherwise, I will give you a call.   If you need additional refills before your next appointment, please call your pharmacy first.   Laura Aguirre Laura Morrison, MD  [facility]

## 2023-12-02 NOTE — Assessment & Plan Note (Addendum)
 BP 146/77 > 146/61 today. Given her diastolic pressure, I will not increase her BP medication.  Continue losartan  100 mg every day  BMP ordered to recheck K with h/o hyperkalemia, suspected due to blood draw hemolysis.

## 2023-12-02 NOTE — Assessment & Plan Note (Addendum)
 A1C 7.9. Urine microalbumin/creatinine ratio ordered today.  She was unable to get the Jardiance . Will bring proof of income to clinic so that we can try to get cost assistance for this medication. Continue metformin  1000 mg BID Will change statin to high intensity: atorvastatin  40 mg every day. Prescription provided today. Lipid panel ordered today

## 2023-12-03 LAB — BASIC METABOLIC PANEL WITH GFR
BUN/Creatinine Ratio: 25 (ref 12–28)
BUN: 27 mg/dL (ref 8–27)
CO2: 20 mmol/L (ref 20–29)
Calcium: 9.3 mg/dL (ref 8.7–10.3)
Chloride: 101 mmol/L (ref 96–106)
Creatinine, Ser: 1.08 mg/dL — ABNORMAL HIGH (ref 0.57–1.00)
Glucose: 99 mg/dL (ref 70–99)
Potassium: 4.3 mmol/L (ref 3.5–5.2)
Sodium: 135 mmol/L (ref 134–144)
eGFR: 53 mL/min/1.73 — ABNORMAL LOW (ref >59)

## 2023-12-03 LAB — LIPID PANEL
Chol/HDL Ratio: 3.1 ratio (ref 0.0–4.4)
Cholesterol, Total: 197 mg/dL (ref 100–199)
HDL: 63 mg/dL (ref >39)
LDL Chol Calc (NIH): 109 mg/dL — ABNORMAL HIGH (ref 0–99)
Triglycerides: 146 mg/dL (ref 0–149)
VLDL Cholesterol Cal: 25 mg/dL (ref 5–40)

## 2023-12-03 LAB — MICROALBUMIN / CREATININE URINE RATIO
Creatinine, Urine: 18.9 mg/dL
Microalb/Creat Ratio: 96 mg/g{creat} — ABNORMAL HIGH (ref 0–29)
Microalbumin, Urine: 18.2 ug/mL

## 2023-12-06 ENCOUNTER — Ambulatory Visit: Payer: Self-pay

## 2023-12-14 ENCOUNTER — Other Ambulatory Visit: Payer: Self-pay

## 2023-12-14 DIAGNOSIS — M1A9XX0 Chronic gout, unspecified, without tophus (tophi): Secondary | ICD-10-CM

## 2023-12-14 MED ORDER — FEBUXOSTAT 40 MG PO TABS: 40.0000 mg | ORAL_TABLET | Freq: Every day | ORAL | 1 refills | Status: AC

## 2023-12-24 DIAGNOSIS — E119 Type 2 diabetes mellitus without complications: Secondary | ICD-10-CM | POA: Diagnosis not present

## 2023-12-24 DIAGNOSIS — H52223 Regular astigmatism, bilateral: Secondary | ICD-10-CM | POA: Diagnosis not present

## 2023-12-24 DIAGNOSIS — H5203 Hypermetropia, bilateral: Secondary | ICD-10-CM | POA: Diagnosis not present

## 2023-12-24 DIAGNOSIS — H524 Presbyopia: Secondary | ICD-10-CM | POA: Diagnosis not present

## 2023-12-24 LAB — OPHTHALMOLOGY REPORT-SCANNED

## 2023-12-24 NOTE — Progress Notes (Signed)
 Laura Aguirre                                          MRN: [id number]   12/24/2023   The VBCI Quality Team Specialist reviewed this patient medical record for the purposes of chart review for care gap closure. The following were reviewed: chart review for care gap closure-controlling blood pressure.    VBCI Quality Team

## 2023-12-24 NOTE — Progress Notes (Signed)
 Laura Aguirre                                          MRN: [id number]   12/24/2023   The VBCI Quality Team Specialist reviewed this patient medical record for the purposes of chart review for care gap closure. The following were reviewed: abstraction for care gap closure-kidney health evaluation for diabetes:eGFR  and uACR.    VBCI Quality Team

## 2023-12-30 ENCOUNTER — Ambulatory Visit: Payer: Self-pay

## 2023-12-30 VITALS — BP 140/70 | HR 71 | Ht 60.0 in | Wt 181.4 lb

## 2023-12-30 DIAGNOSIS — M1A9XX0 Chronic gout, unspecified, without tophus (tophi): Secondary | ICD-10-CM

## 2023-12-30 DIAGNOSIS — Z1211 Encounter for screening for malignant neoplasm of colon: Secondary | ICD-10-CM | POA: Diagnosis not present

## 2023-12-30 DIAGNOSIS — N1831 Chronic kidney disease, stage 3a: Secondary | ICD-10-CM | POA: Diagnosis not present

## 2023-12-30 DIAGNOSIS — I10 Essential (primary) hypertension: Secondary | ICD-10-CM | POA: Diagnosis not present

## 2023-12-30 DIAGNOSIS — E119 Type 2 diabetes mellitus without complications: Secondary | ICD-10-CM | POA: Diagnosis not present

## 2023-12-30 MED ORDER — AMLODIPINE BESYLATE 5 MG PO TABS: 5.0000 mg | ORAL_TABLET | Freq: Every day | ORAL | 3 refills | Status: AC

## 2023-12-30 NOTE — Patient Instructions (Addendum)
 Para su diabetes: por favor traiga sus documentos de ingresos para ayudar a obtener la medicacin Jardiance . Para su presin arterial alta: contine tomando su losartn 100 mg, puede tomarlo por la omnicom. Tambin estoy recetando un nuevo medicamento llamado amlodipino 5 mg, que tomar carmax por la maana una vez al da. He incluido documentacin sobre planificacin avanzada de cuidado. Puede revisarlo con su esposo y su hijo para ayudar a marine scientist las decisiones de salud en el futuro. Si tiene [employer], con gusto podemos programar otra cita para revisar las opciones de planificacin de cuidado. He hecho una referencia para una colonoscopia de deteccin para descartar cncer de colon. Debera recibir una llamada de la oficina de gastroenterologa en las prximas 2 semanas para programar una cita. Si no recibe noticias de ellos antes de [place], por favor llmenos de nuevo en el nuevo ao para que esto se pueda atender.   Para sus brotes de gota, la prxima vez que tenga un brote, por favor llame a la oficina para una cita el mismo da o al da siguiente, para que el Dr. 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  Tiene una cita de seguimiento en febrero, pero por favor llame si surge alguna necesidad antes de esa cita.  Atentamente,   Dr. Alena   For your diabetes-please bring in your income paperwork to help get the Jardiance  medication  For your high blood pressure-continue taking your losartan  100 mg, you can take this in the morning.  I am also prescribing a new medication called amlodipine  5 mg which you will take every morning once a day.  I have included advanced care planning documentation.  You can review this with your husband and son to help make plans for your health decisions in the future.  If you have any questions, I am happy to have a another appointment to review care planning choices.  I have placed a referral  for a screening colonoscopy to rule out colon cancer.  You should get a call from the GI office in the next 2 weeks to schedule an appointment.  If you do not hear from them by Christmas, please call us  back in the new year to get this taken care of.  For your gout flares, the next time you have a flare, please call the office for a same-day or next day appointment so that Dr. Here can evaluate and make sure that there is not something else contributing to your gout flare.  You have a follow-up appointment in February, but please call back for any new needs that arise prior to that appointment.  Best, Dr. Alena

## 2023-12-30 NOTE — Assessment & Plan Note (Signed)
 Check uric acid today given recurrent flares. Discussed changing febuxostat  back to allopurinol  and risk of cardiac events with febuxastat; however, after discussion with patient, she opted to stay on the febuxostat  given the cost of allopurinol . Asked her to come to the clinic the next time she has a gout flare so she can be evaluated to ensure no alternate pathology contributing to symptoms.

## 2023-12-30 NOTE — Assessment & Plan Note (Signed)
 Patient still not gotten Jardiance .  He has not had time to bring her income documentation to clinic to qualify for financial assistance.  She will try to do this after the holidays.  She is complaining the medium intensity statin that she already has at home prior to starting the atorvastatin  40 mg prescribed at her last visit.

## 2023-12-30 NOTE — Assessment & Plan Note (Signed)
 BP 140/70 and 135/78 standing.  Above goal <130/80.  She is taking losartan  100 mg nightly.  Will add amlodipine  5 mg daily.

## 2023-12-30 NOTE — Progress Notes (Signed)
 SUBJECTIVE:   Chief compliant/HPI: annual examination  Laura Aguirre is a 78 y.o. who presents today for an annual exam.   Having monthly gout flares, last last week. She is taking febuxustat daily. Last uric acid level 5.6 in April 2025. Took 4 pills of colchicine  last week with improvement of her symptoms.   History tabs reviewed and updated.   Review of systems form reviewed and notable for gout flares as above.   OBJECTIVE:   BP (!) 140/70   Pulse 71   Ht 5' (1.524 m)   Wt 181 lb 6.4 oz (82.3 kg)   SpO2 98%   BMI 35.43 kg/m   Standing BP 135/78  General: Well-appearing female in no acute distress HEENT: Ears without cerumen, bilateral tympanic membrane normal, nasal mucosa moist and without erythema, oropharynx normal, no lymphadenopathy, no tonsillar enlargement CV: RRR, no murmurs, 2+ radial pulses bilaterally, no pretibial edema Pulm: Lungs clear to auscultation bilaterally, no increased work of breathing Abdomen: Soft, obese, nontender, nondistended MSK: 5 out of 5 strength in the bilateral upper and lower extremities  ASSESSMENT/PLAN:   Assessment & Plan Essential hypertension BP 140/70 and 135/78 standing.  Above goal <130/80.  She is taking losartan  100 mg nightly.  Will add amlodipine  5 mg daily. Chronic gout without tophus, unspecified cause, unspecified site Check uric acid today given recurrent flares. Discussed changing febuxostat  back to allopurinol  and risk of cardiac events with febuxastat; however, after discussion with patient, she opted to stay on the febuxostat  given the cost of allopurinol . Asked her to come to the clinic the next time she has a gout flare so she can be evaluated to ensure no alternate pathology contributing to symptoms. Screening for colon cancer Referral for colonoscopy after shared decision making. CKD stage 3a, GFR 45-59 ml/min (HCC) Type 2 diabetes mellitus without complication, without long-term current use of insulin  (HCC) Patient still not gotten Jardiance .  He has not had time to bring her income documentation to clinic to qualify for financial assistance.  She will try to do this after the holidays.  She is complaining the medium intensity statin that she already has at home prior to starting the atorvastatin  40 mg prescribed at her last visit.   Annual Examination  Advance directives discussed.  Patient would like her husband to be her primary decision-maker if she cannot be.  Notes that her son is also engaged in their overall health planning.  I provided her a handout for ACP today.  She will make an appointment if she has any further questions regarding health goals for the future and ACP documentation.  Considered the following items based upon USPSTF recommendations: Diabetes screening: DM actively managed Patient not sexually active for a while, no STI testing planned today after shared decision making. Lipid panel (nonfasting or fasting) recently completed Osteoporosis screening, last in 2011 revealed osteopenia. Recommended repeat DEXA today; however, patient would like to hold off on this for now.  Can revisit down the road.  Cancer Screening Discussion  Cervical cancer screening: Not indicated given patient's age and prior normal Pap smears completed regularly per patient report Breast cancer screening: Not indicated given patient's age, prior normal mammograms completed regularly per patient report. Lung cancer screening:not indicated as does not meet criteria.  See documentation below regarding indications/risks/benefits.  Colorectal cancer screening: discussed, colonoscopy ordered.    Follow up in February as already scheduled or sooner if indicated.   Laura Aguirre Laura Morrison, MD [facility]

## 2023-12-31 ENCOUNTER — Ambulatory Visit: Payer: Self-pay

## 2023-12-31 LAB — URIC ACID: Uric Acid: 5.5 mg/dL (ref 3.1–7.9)

## 2024-01-04 NOTE — Progress Notes (Signed)
 Laura Aguirre                                          MRN: [id number]   01/04/2024   The VBCI Quality Team Specialist reviewed this patient medical record for the purposes of chart review for care gap closure. The following were reviewed: chart review for care gap closure-controlling blood pressure.    VBCI Quality Team

## 2024-01-19 ENCOUNTER — Other Ambulatory Visit: Payer: Self-pay

## 2024-01-20 MED ORDER — LOSARTAN POTASSIUM 100 MG PO TABS: 100.0000 mg | ORAL_TABLET | Freq: Every day | ORAL | 3 refills | Status: AC

## 2024-02-02 NOTE — Progress Notes (Signed)
 Laura Aguirre                                          MRN: [id number]   02/02/2024   The VBCI Quality Team Specialist reviewed this patient medical record for the purposes of chart review for care gap closure. The following were reviewed: chart review for care gap closure-controlling blood pressure.    VBCI Quality Team

## 2024-02-03 NOTE — Telephone Encounter (Signed)
 PAP: Patient has been denied for patient assistance by [employer] (BI Cares) at [phone] due to missing income not received in a timely manner.

## 2024-02-16 ENCOUNTER — Encounter: Payer: Self-pay | Admitting: Gastroenterology

## 2024-03-08 ENCOUNTER — Ambulatory Visit: Payer: Self-pay | Admitting: Student
